# Patient Record
Sex: Female | Born: 1955 | Race: White | Hispanic: No
Health system: Midwestern US, Academic
[De-identification: ages and names within clinical notes are randomized; demographics above are authoritative.]

---

## 2016-06-27 ENCOUNTER — Encounter: Admit: 2016-06-27 | Discharge: 2016-06-27 | Payer: Commercial Managed Care - Pharmacy Benefit Manager

## 2016-06-27 NOTE — Telephone Encounter
This MA LVM notifying pt Labs are past due and to get the drawn as quickly as possible.

## 2016-07-05 ENCOUNTER — Encounter: Admit: 2016-07-05 | Discharge: 2016-07-05 | Payer: Commercial Managed Care - Pharmacy Benefit Manager

## 2016-07-05 NOTE — Telephone Encounter
This MA LVM reminding pt to get past due HCV labs drawn asap.

## 2016-07-06 ENCOUNTER — Encounter: Admit: 2016-07-06 | Discharge: 2016-07-06 | Payer: Commercial Managed Care - Pharmacy Benefit Manager

## 2016-07-06 NOTE — Progress Notes
Laura Klein completed hepatitis C treatment on 03/30/16.   The patient has received the entire supply of medication and no longer requires additional refills.        Additional testing is needed to proceed with evaluation of sustained viral response (SVR).  The patients clinic nurse was notified that the patient needs a hepatitis C viral load twelve weeks after completion of hepatitis C treatment to establish SVR.     Once the hepatitis C viral load lab is completed, SVR will be evaluated.  The patients clinic nurse is aware .         Annamarie DawleyJennifer Tammra Pressman, [place]  [phone]

## 2016-07-13 ENCOUNTER — Encounter: Admit: 2016-07-13 | Discharge: 2016-07-13 | Payer: Commercial Managed Care - Pharmacy Benefit Manager

## 2016-08-03 ENCOUNTER — Encounter: Admit: 2016-08-03 | Discharge: 2016-08-03 | Payer: Commercial Managed Care - Pharmacy Benefit Manager

## 2016-08-03 NOTE — Progress Notes
Inez CatalinaMarlene Ann Kozloski completed hepatitis C treatment on 03/30/16.   The patient has received the entire supply of medication and no longer requires additional refills.         Additional testing is needed to proceed with evaluation of sustained viral response (SVR).  The patients clinic nurse was notified that the patient needs a hepatitis C viral load twelve weeks after completion of hepatitis C treatment to establish SVR.     The patient has not completed the hepatitis C viral load lab to evaluate SVR.  Thus, the patient will be removed from the specialty patient management program.  The patient may be re-enrolled at any time should they complete necessary labs or re-establish care with the clinic.  The patients clinic nurse is aware to contact the hepatology pharmacist at [phone] or [email or web address] with questions and/or updates.         Waunita SchoonerMeera Dalanie Kisner, Jacklynn BuePHARMD, PharmD  [phone]

## 2017-08-18 ENCOUNTER — Encounter: Admit: 2017-08-18 | Discharge: 2017-08-18 | Payer: Commercial Managed Care - Pharmacy Benefit Manager

## 2017-08-20 IMAGING — US ABDC
1 series · 14 of 25 positions shown · non-contrast
Comparison: none

[Series 1: us abdominal complete · 14 of 87 slices shown]
[im 1/87]
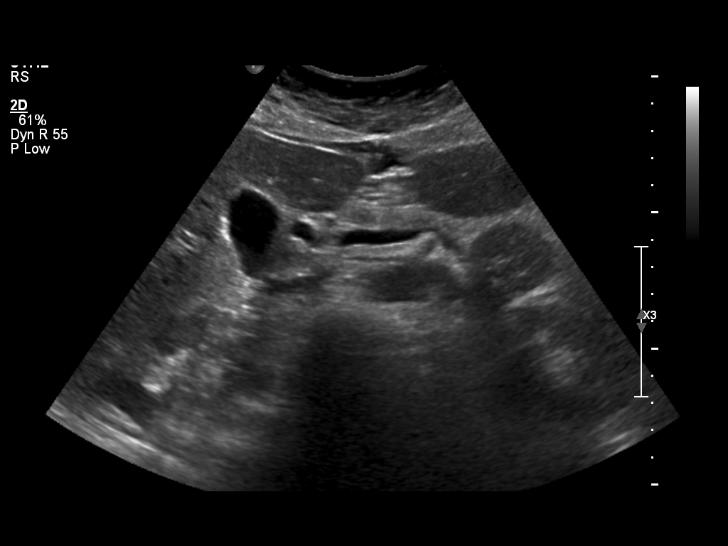
[im 8/87]
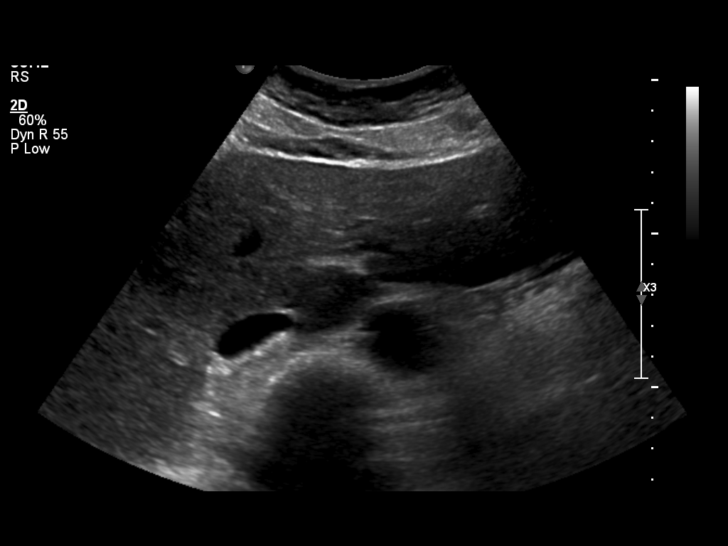
[im 15/87]
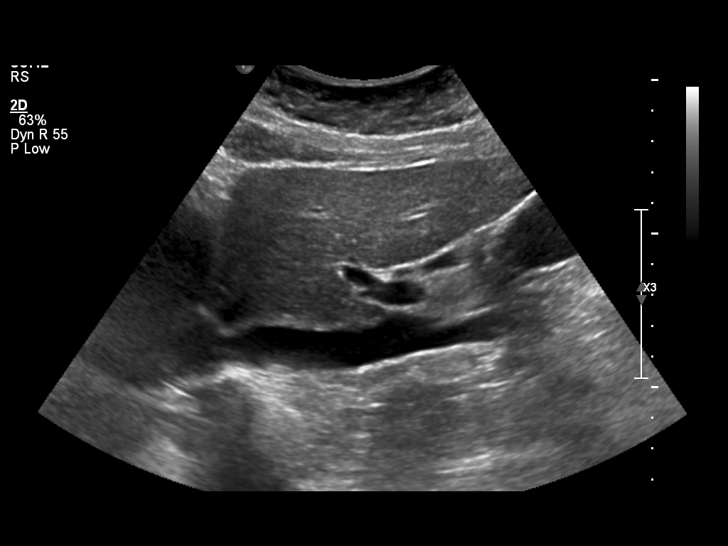
[im 22/87]
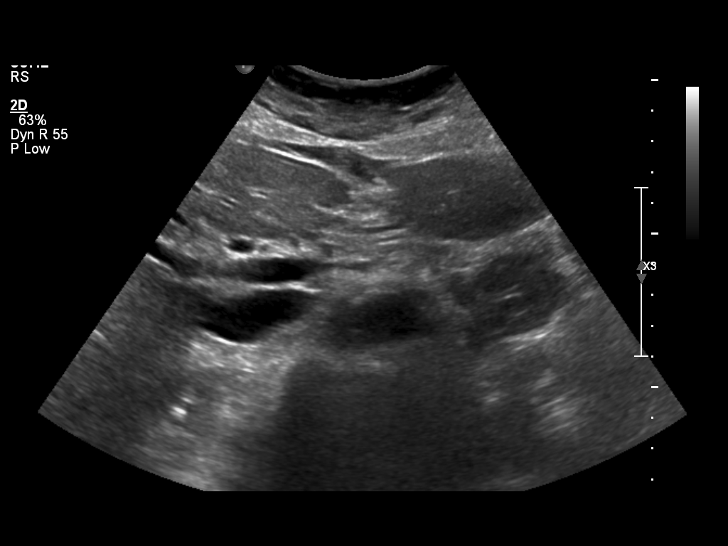
[im 29/87]
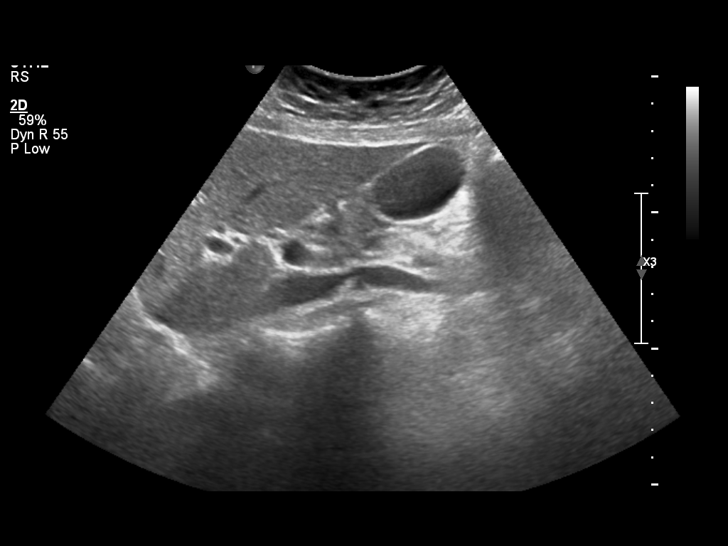
[im 33/87]
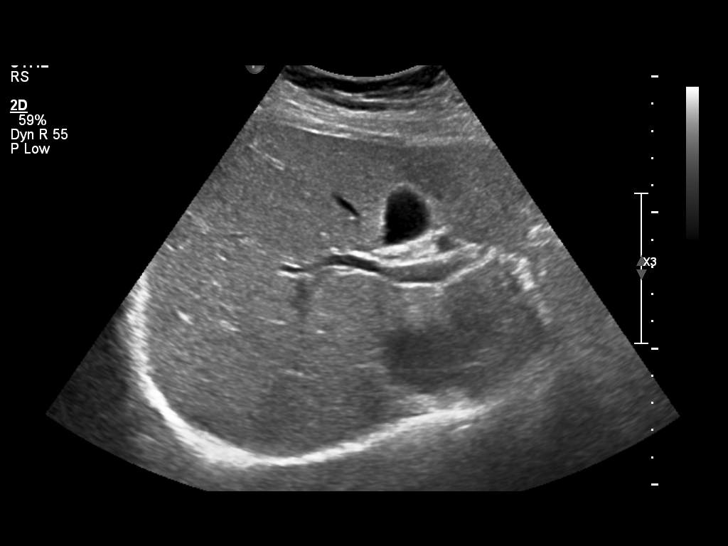
[im 40/87]
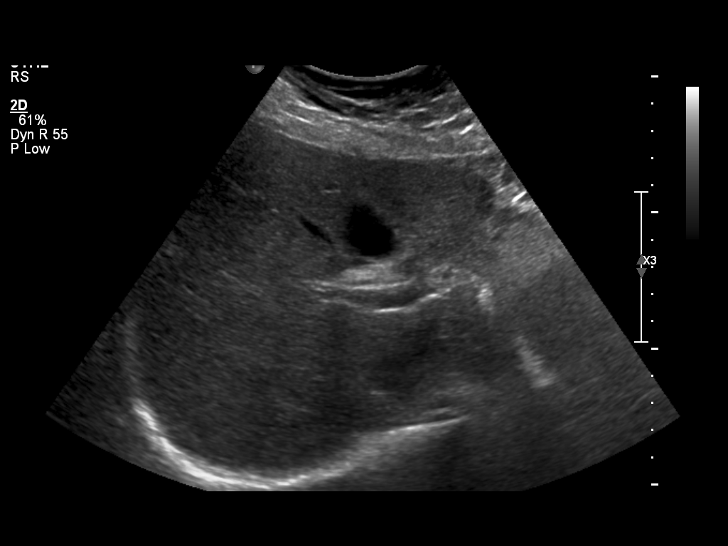
[im 47/87]
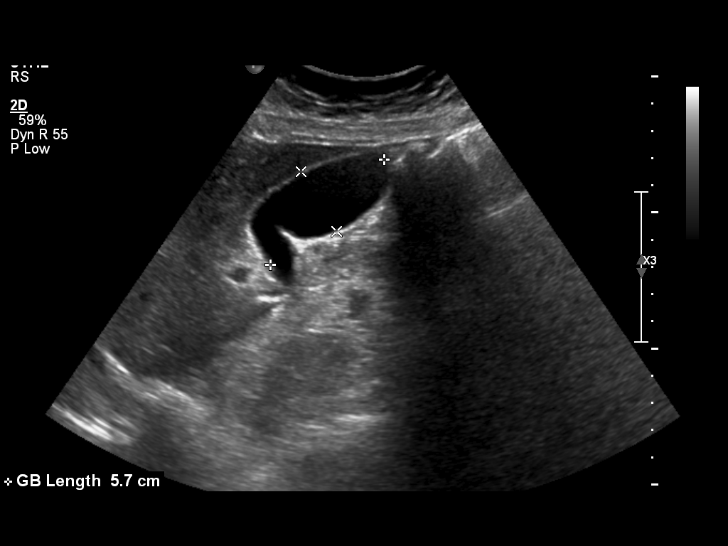
[im 54/87]
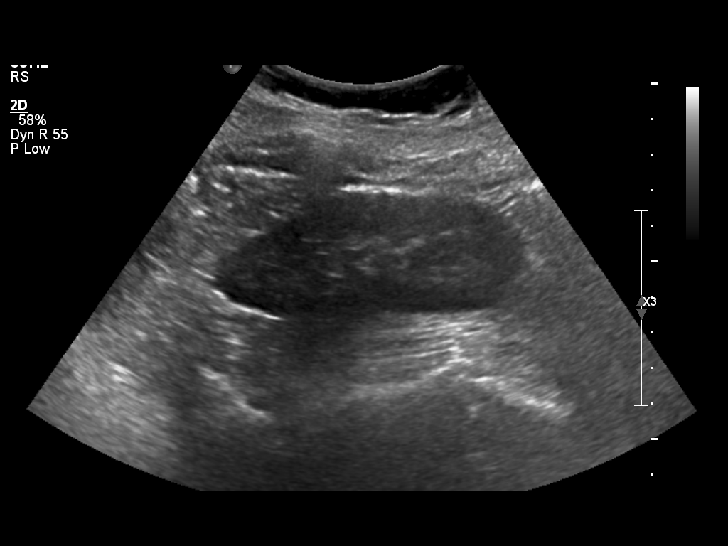
[im 58/87]
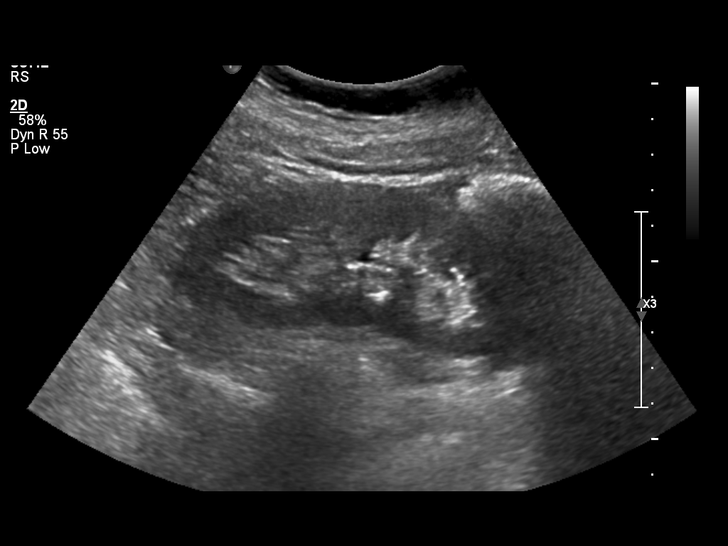
[im 65/87]
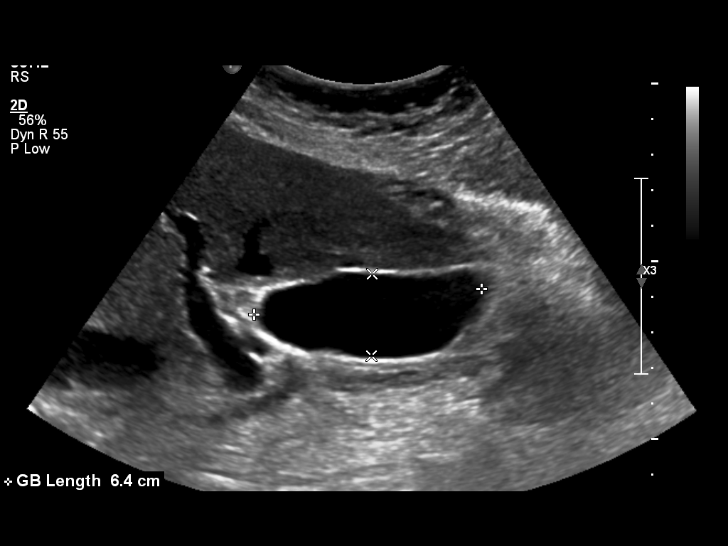
[im 72/87]
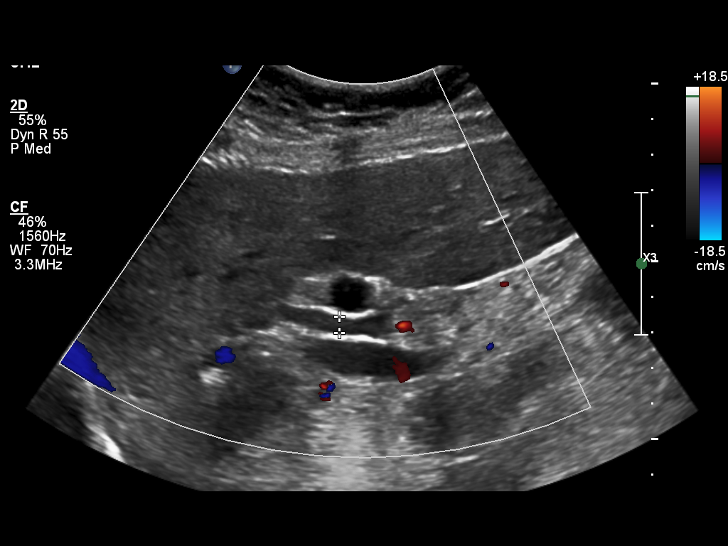
[im 79/87]
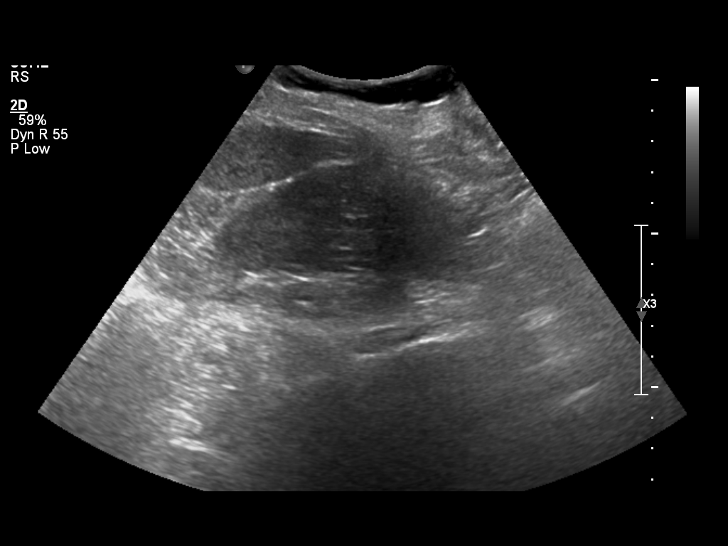
[im 87/87]
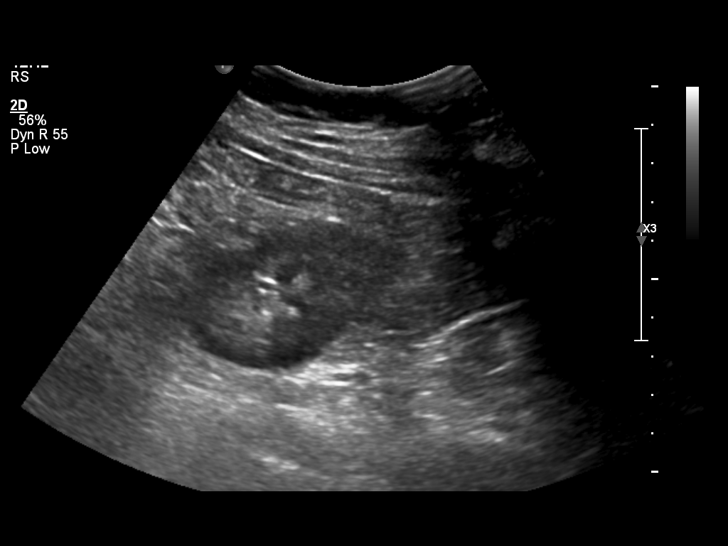

[14 of 25 positions shown; findings below may reference images not displayed]

ULTRASOUND REPORT

DIAGNOSTIC STUDIES

EXAM

ABDOMINAL ULTRASOUND

INDICATION

HEPATITIS C
HEPATITIS C. PT STATED HX OF DRINKING 15 YRS AGO.

TECHNIQUE

Real time sonography of the abdomen is performed.

COMPARISONS

None

FINDINGS

The size and contour of the liver and spleen are normal. No focal parenchmymal abnormalities are
identified.

Gallbadder size and wall thickness are normal. No gallstones are identified. No tenderness to
placement of the transducer over the gallbladder fossa is present. No pericholecystic fluid is
present. Common duct is Maness measuring 0.49 cm.

The pancreas is incompletely visualized; the visualized portion is unremarkable.

The visualized portions of the aorta and IVC are unremarkable.

Size and contour of the kidneys are normal without focal parenchymal abnormality. Right kidney
measures 10.3 x 4.0 x 4.7cm and the left kidney measures 10.5 x 5.3 x 4.6cm. No hydronephrosis is
identified.

IMPRESSION

Incomplete visualization of the pancreas. Otherwise, normal real-time sonography of the abdomen.

## 2017-10-06 ENCOUNTER — Encounter: Admit: 2017-10-06 | Discharge: 2017-10-06 | Payer: Commercial Managed Care - Pharmacy Benefit Manager

## 2018-03-09 LAB — COMPREHENSIVE METABOLIC PANEL
Lab: 0.2
Lab: 0.6
Lab: 10
Lab: 13
Lab: 135 — ABNORMAL LOW (ref 136–145)
Lab: 15
Lab: 26
Lab: 3.7
Lab: 60 — ABNORMAL HIGH (ref 5–34)
Lab: 66 — ABNORMAL HIGH (ref 0–55)
Lab: 7.6
Lab: 80
Lab: 92

## 2018-03-09 LAB — TROPONIN-I: Lab: 0 — ABNORMAL HIGH (ref 0.000–0.013)

## 2018-03-09 LAB — CREATINE KINASE-CPK: Lab: 705 — ABNORMAL HIGH (ref 29–168)

## 2018-03-11 LAB — BASIC METABOLIC PANEL
Lab: 0.7
Lab: 13
Lab: 138 — ABNORMAL LOW (ref 4.20–5.40)
Lab: 21 — ABNORMAL HIGH (ref 9.8–20.1)
Lab: 27
Lab: 4.1 — ABNORMAL LOW (ref 12.0–16.0)
Lab: 80 — ABNORMAL HIGH (ref 11.5–14.5)
Lab: 89
Lab: 9.4

## 2018-03-11 LAB — CBC: Lab: 12 — ABNORMAL HIGH (ref 4.8–10.8)

## 2018-06-01 ENCOUNTER — Encounter: Admit: 2018-06-01 | Discharge: 2018-06-01 | Payer: Commercial Managed Care - Pharmacy Benefit Manager

## 2018-06-04 ENCOUNTER — Encounter: Admit: 2018-06-04 | Discharge: 2018-06-04 | Payer: Commercial Managed Care - Pharmacy Benefit Manager

## 2018-06-04 DIAGNOSIS — R079 Chest pain, unspecified: ICD-10-CM

## 2018-06-04 DIAGNOSIS — R55 Syncope and collapse: Principal | ICD-10-CM

## 2018-06-05 ENCOUNTER — Encounter: Admit: 2018-06-05 | Discharge: 2018-06-05 | Payer: Commercial Managed Care - Pharmacy Benefit Manager

## 2018-06-05 DIAGNOSIS — R079 Chest pain, unspecified: ICD-10-CM

## 2018-06-05 DIAGNOSIS — R55 Syncope and collapse: Principal | ICD-10-CM

## 2018-06-05 NOTE — Progress Notes
Called pt to go over in clinic guidelines for appointment w/ [facility] on 5.19.20. pt is not feeling well today- diarrhea and weakness. pt wishes to cancel appointment today. rescheduled for 5.21.20 @ 400 w/ TLR. gave pt my desk number to call on Thursday if still not feeling well. Advised pt to contact pcp w/ symptoms

## 2018-06-07 ENCOUNTER — Encounter: Admit: 2018-06-07 | Discharge: 2018-06-07 | Payer: Commercial Managed Care - Pharmacy Benefit Manager

## 2019-09-07 IMAGING — CT BRAIN WO(Adult)
3 of 4 series · 14 of 27 positions shown, 16 images · non-contrast
Comparison: none

[Series 2: brain ax 5.00 hr40 s3 · axial · 0.35mm/px · z∈[-544,-459]mm · 8 of 12 slices shown, 10 images]
[im 2/12  brain]
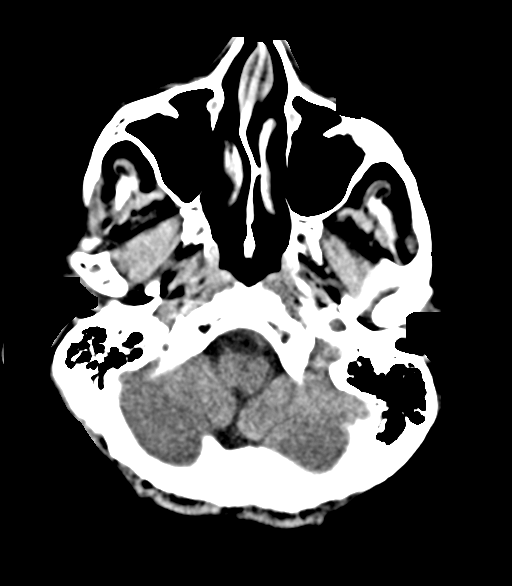
[im 2/12  bone]
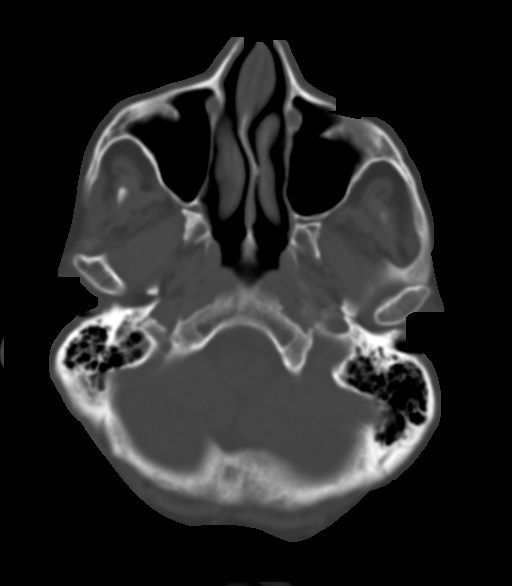
[im 3/12  brain]
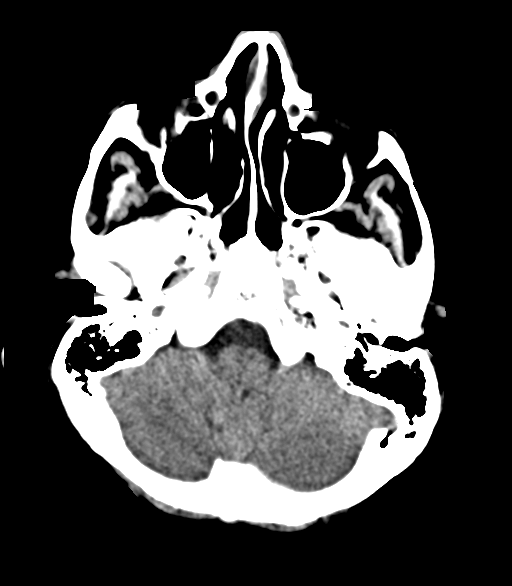
[im 5/12  brain]
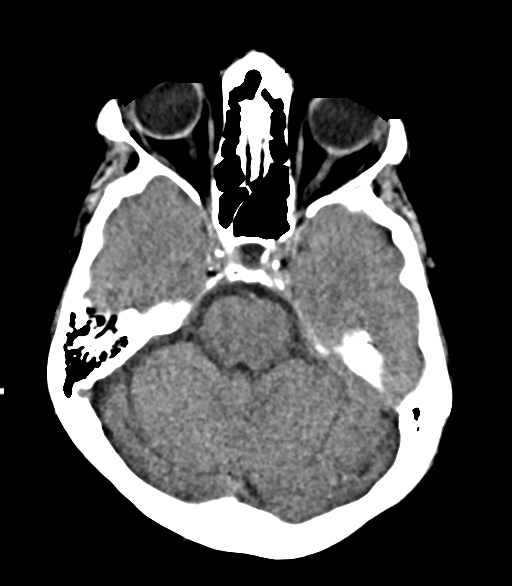
[im 6/12  brain]
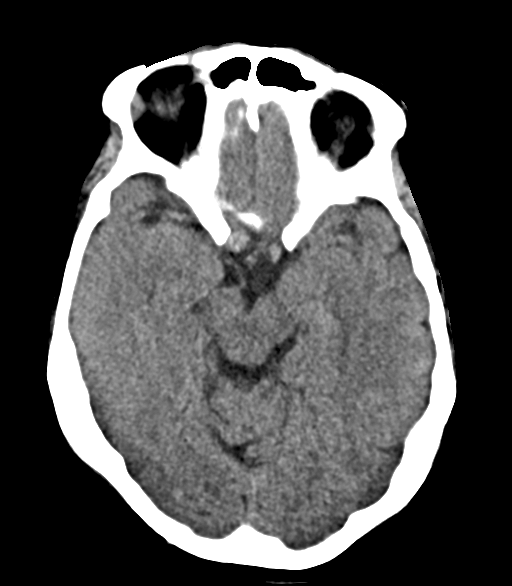
[im 7/12  brain]
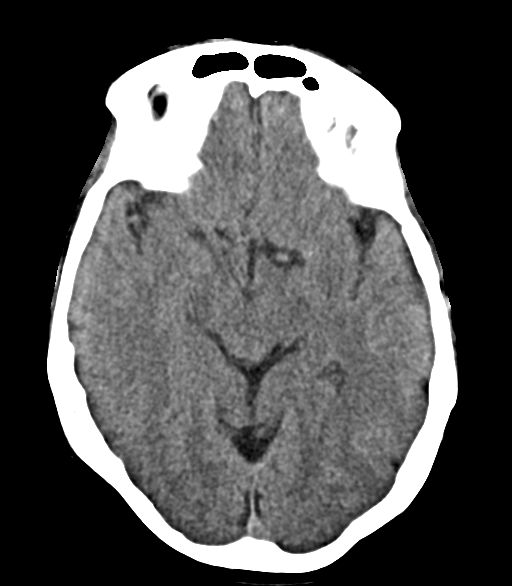
[im 7/12  bone]
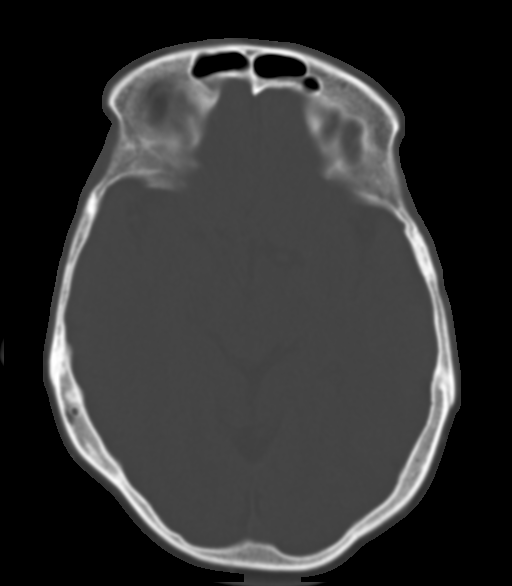
[im 8/12  brain]
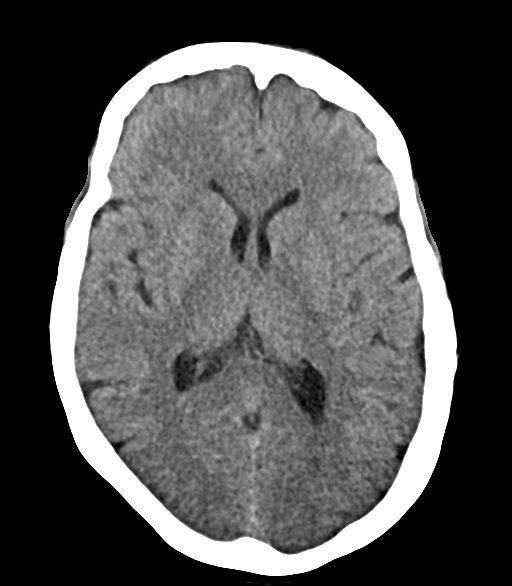
[im 10/12  brain]
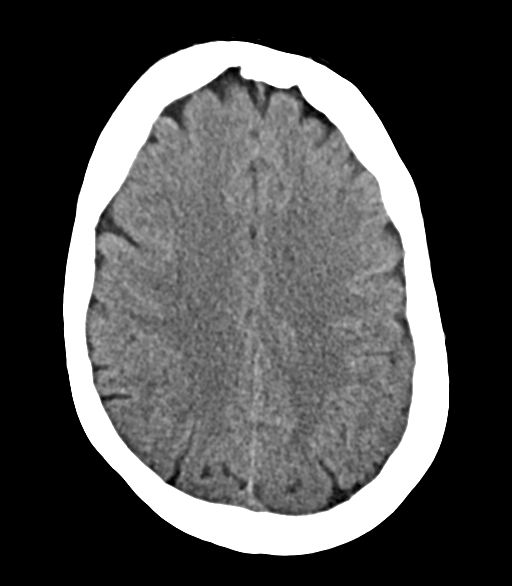
[im 11/12  brain]
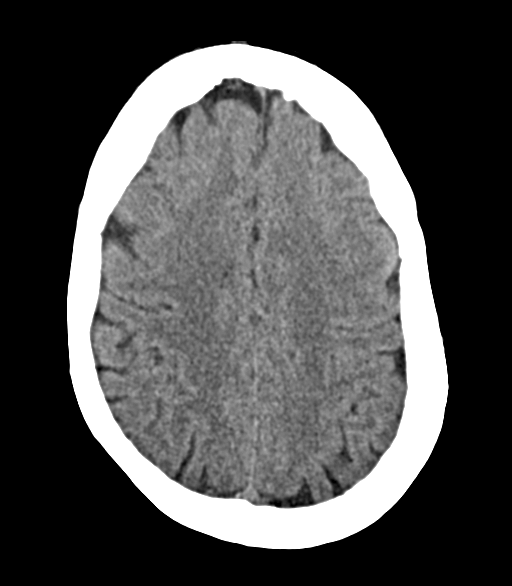

[Series 4: brain cor 5.00 hr40 s3 · coronal · 0.33mm/px · 3 of 9 slices shown]
[im 4/9  brain]
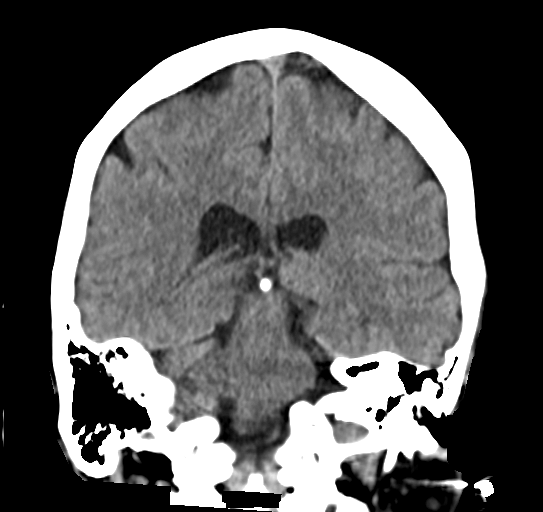
[im 5/9  brain]
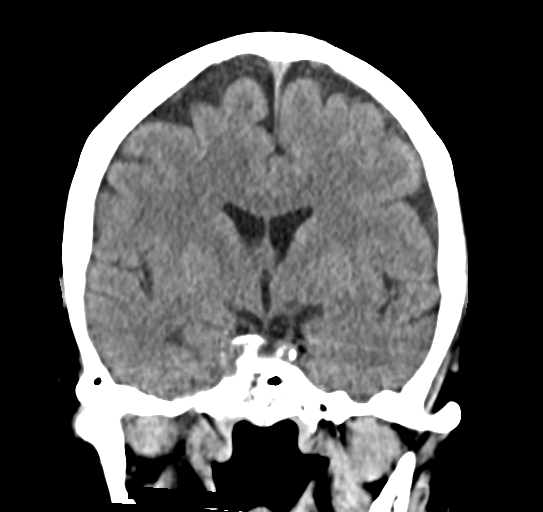
[im 6/9  brain]
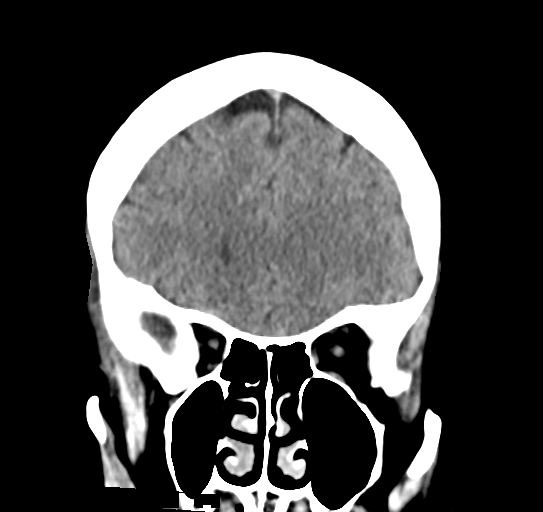

[Series 6: brain sag 5.00 hr40 s3 · sagittal · 0.33mm/px · 3 of 5 slices shown]
[im 2/5  brain]
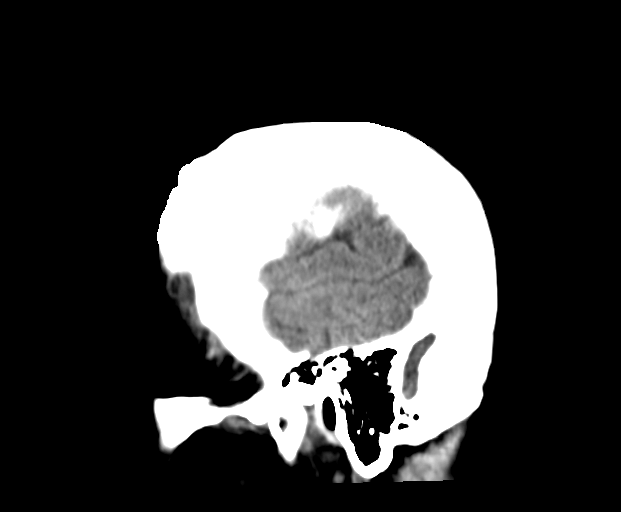
[im 3/5  brain]
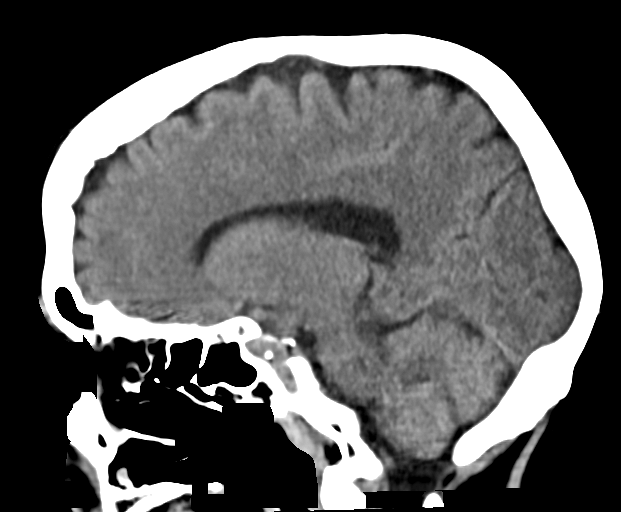
[im 4/5  brain]
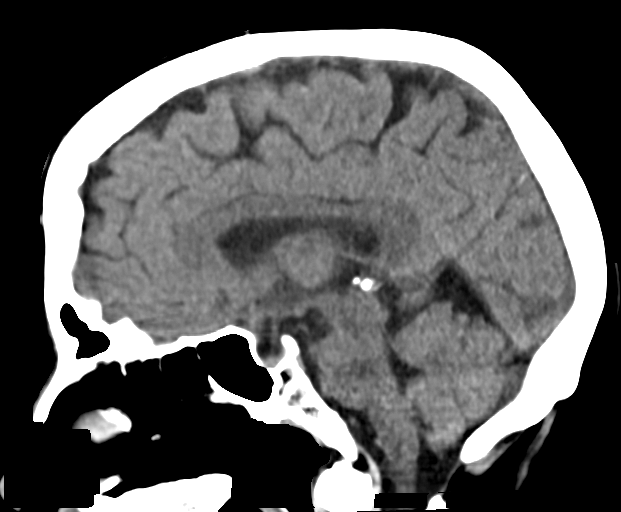

[14 of 27 positions shown; findings below may reference images not displayed]

DIAGNOSTIC STUDIES

EXAM

COMPUTED TOMOGRAPHY, HEAD OR BRAIN; WITHOUT CONTRAST MATERIAL, CPT 74414

INDICATION

CLOSED HEAD INJURY. CONFUSION. PATIENT COMPLAINS OF SUSTAINING A FALL X 3 DAYS AGO. HEADACHE.
SORE. TJ

TECHNIQUE

Multiple contiguous transaxial images were obtained through the brain utilizing a multidetector CT
scanner.

All CT scans at this facility use dose modulation, iterative reconstruction, and/or weight based
dosing when appropriate to reduce radiation dose to as low as reasonably achievable.

COMPARISONS

There are no previous examinations available for comparison at the time of dictation.

FINDINGS

The ventricles and sulci appear are normal in caliber. There is no mass effect or shift in the
midline structures. There are no intra or extra axial collections of blood or fluid present. The
skull base and overlying calvarium are within normal limits.

IMPRESSION

Normal CT brain.

Tech Notes:

PT C/O FALL X 3 DAYS AGO. HEADACHE. SORE. TJ

## 2019-09-11 IMAGING — CR PELVIS
3 series · 3 of 3 positions shown · non-contrast
Comparison: none

[pelvis]
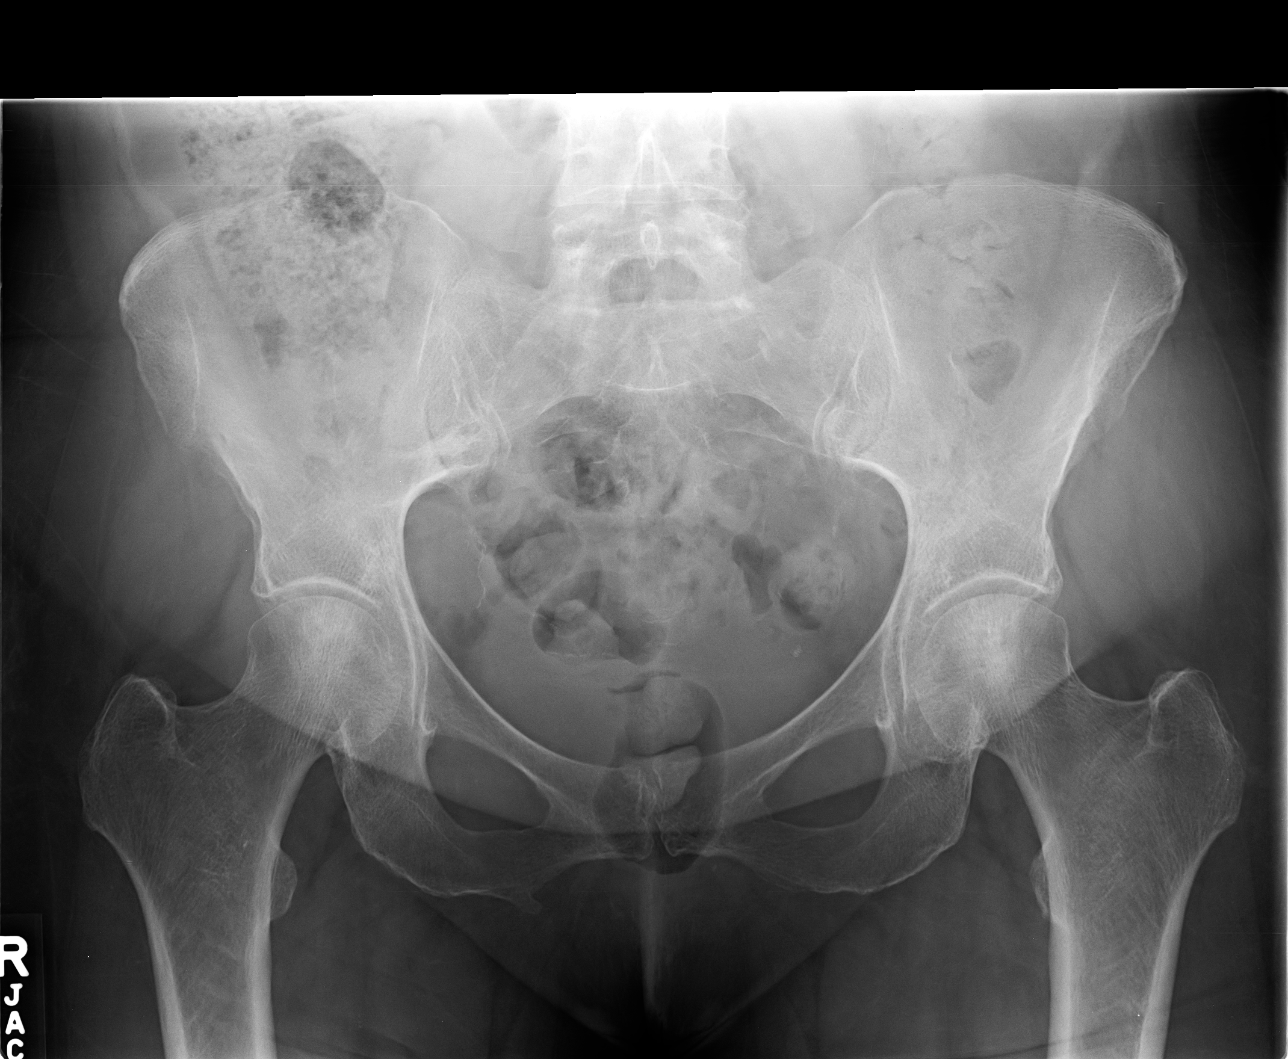

[hip ap]
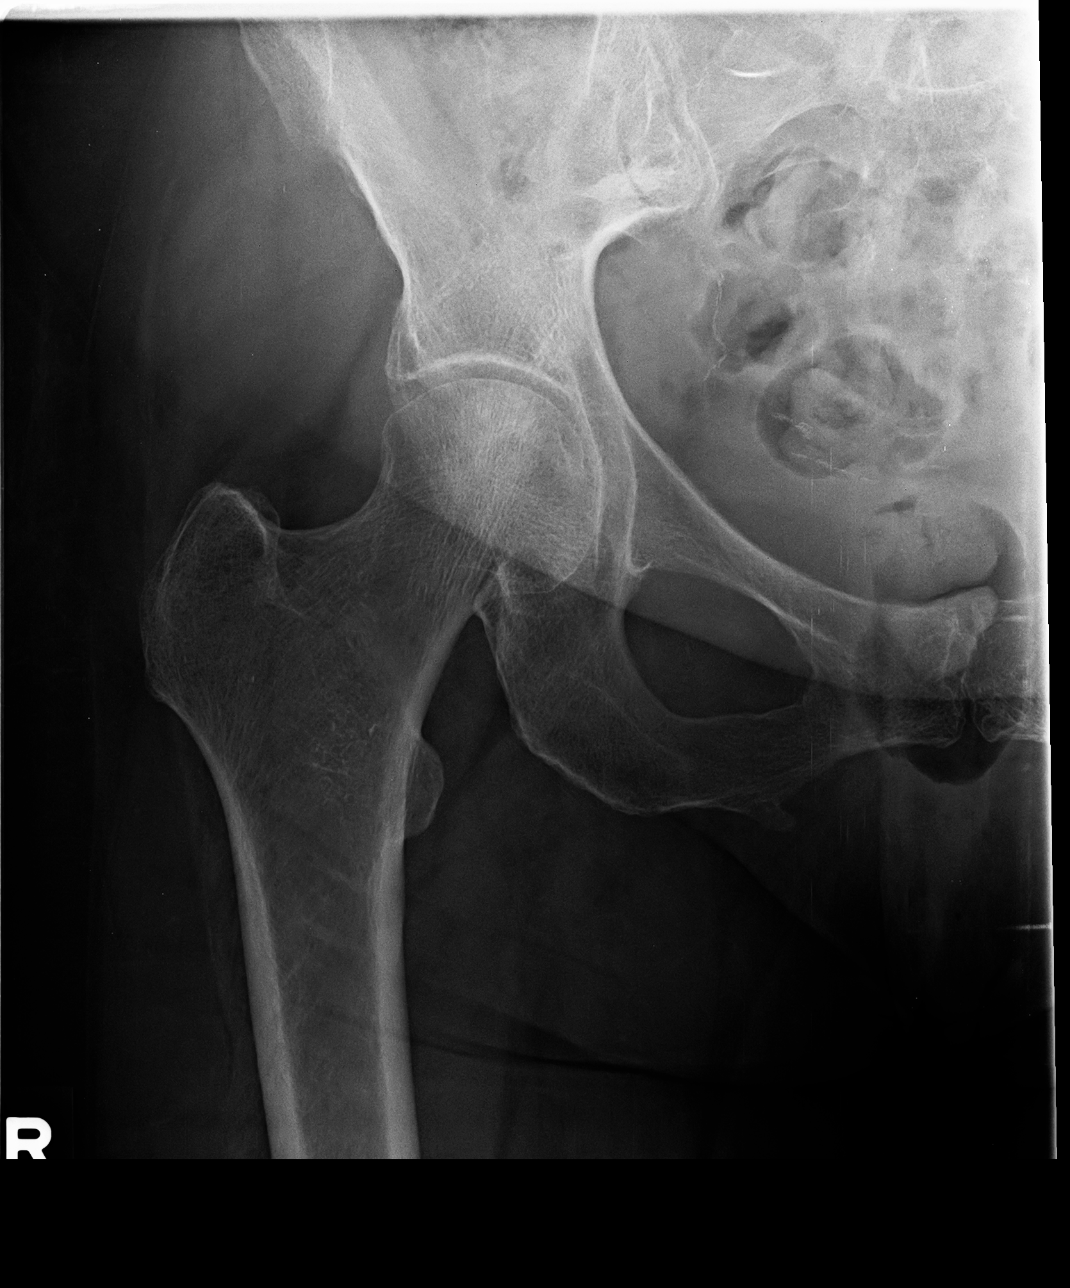

[hip frog]
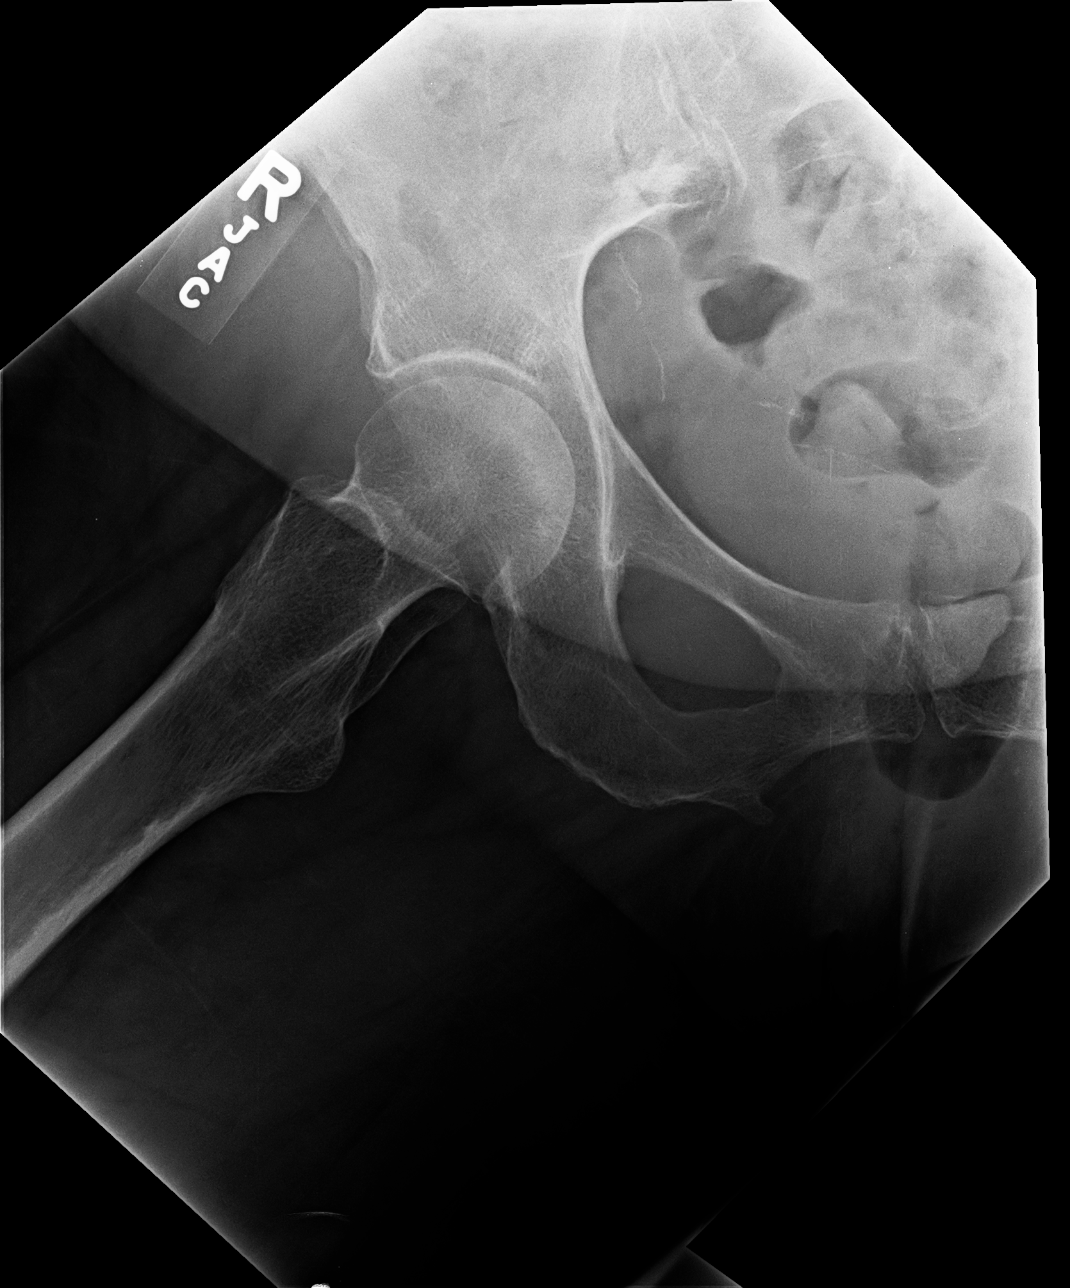

[3 of 3 positions shown; findings below may reference images not displayed]

EXAM

XR hip RT, 2-3V w or wo pelvis

INDICATION

Status post fall

TECHNIQUE

Three views of the right hip

COMPARISONS

None available at the time of dictation.

FINDINGS

No radiographic evidence of an acute fracture, osseous malalignment, or aggressive focal osseous
lesion. Mild degenerative change of the pubic symphyseal joint. Small inferiorly projecting osseous
excrescence along the right inferior pubic ramus, which may be sequelae of prior trauma versus
chronic tug injury along the adductor muscle compartment. Mild amount of stool burden within the
cecum/ascending colon.

IMPRESSION
- No radiographic evidence of an acute osseous abnormality or significant degenerative change of
the right hip.

Tech Notes:

fall today and hit right hip. jc/me

## 2019-09-11 IMAGING — CR PELVIS
3 series · 3 of 3 positions shown · non-contrast
Comparison: none

[l-spine ap]
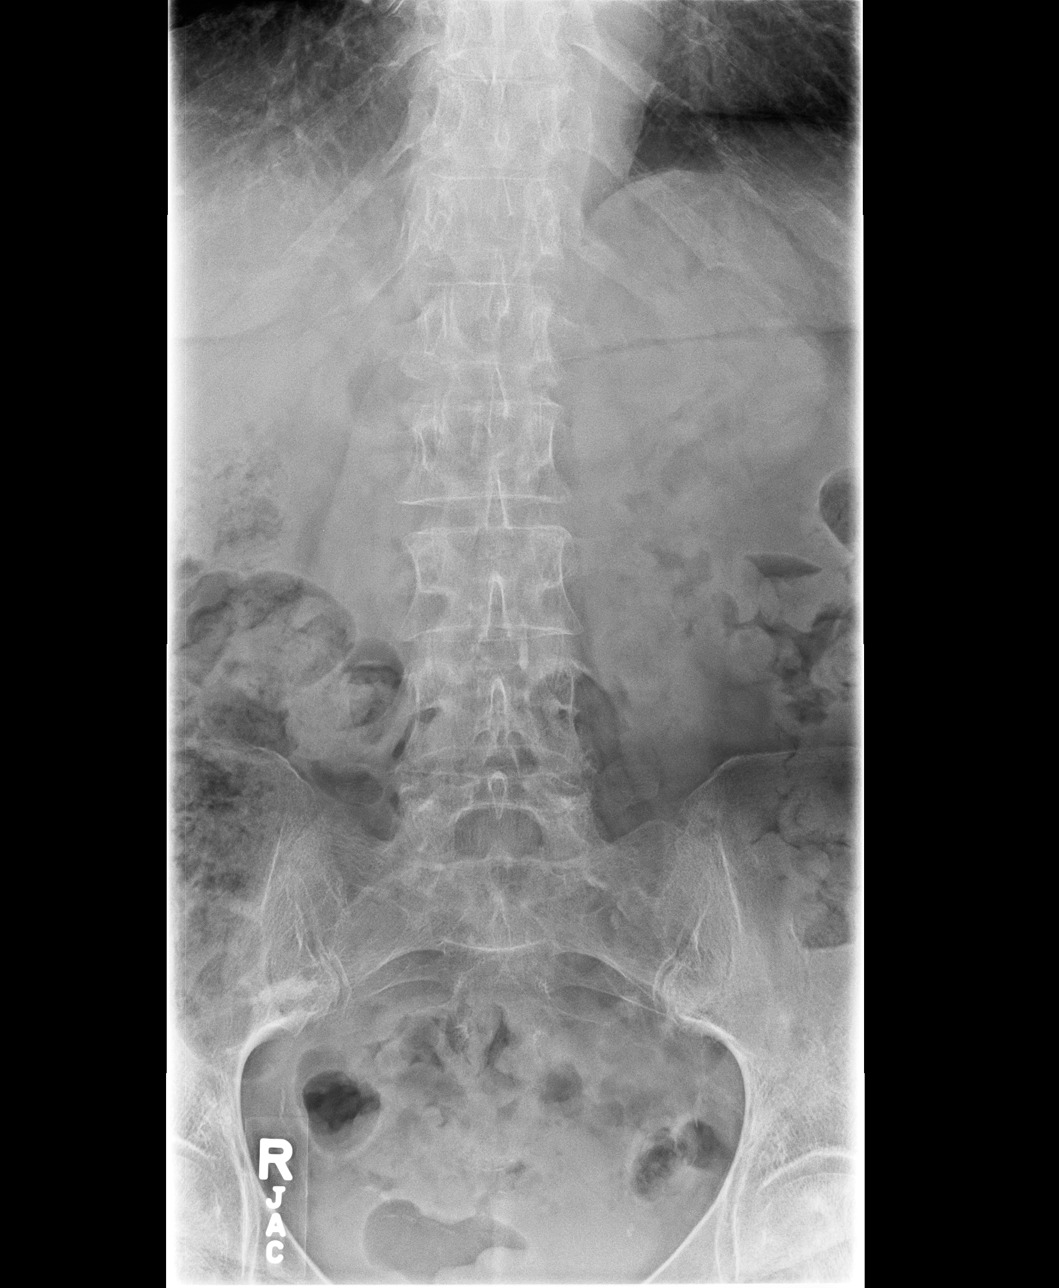

[l-spine lat]
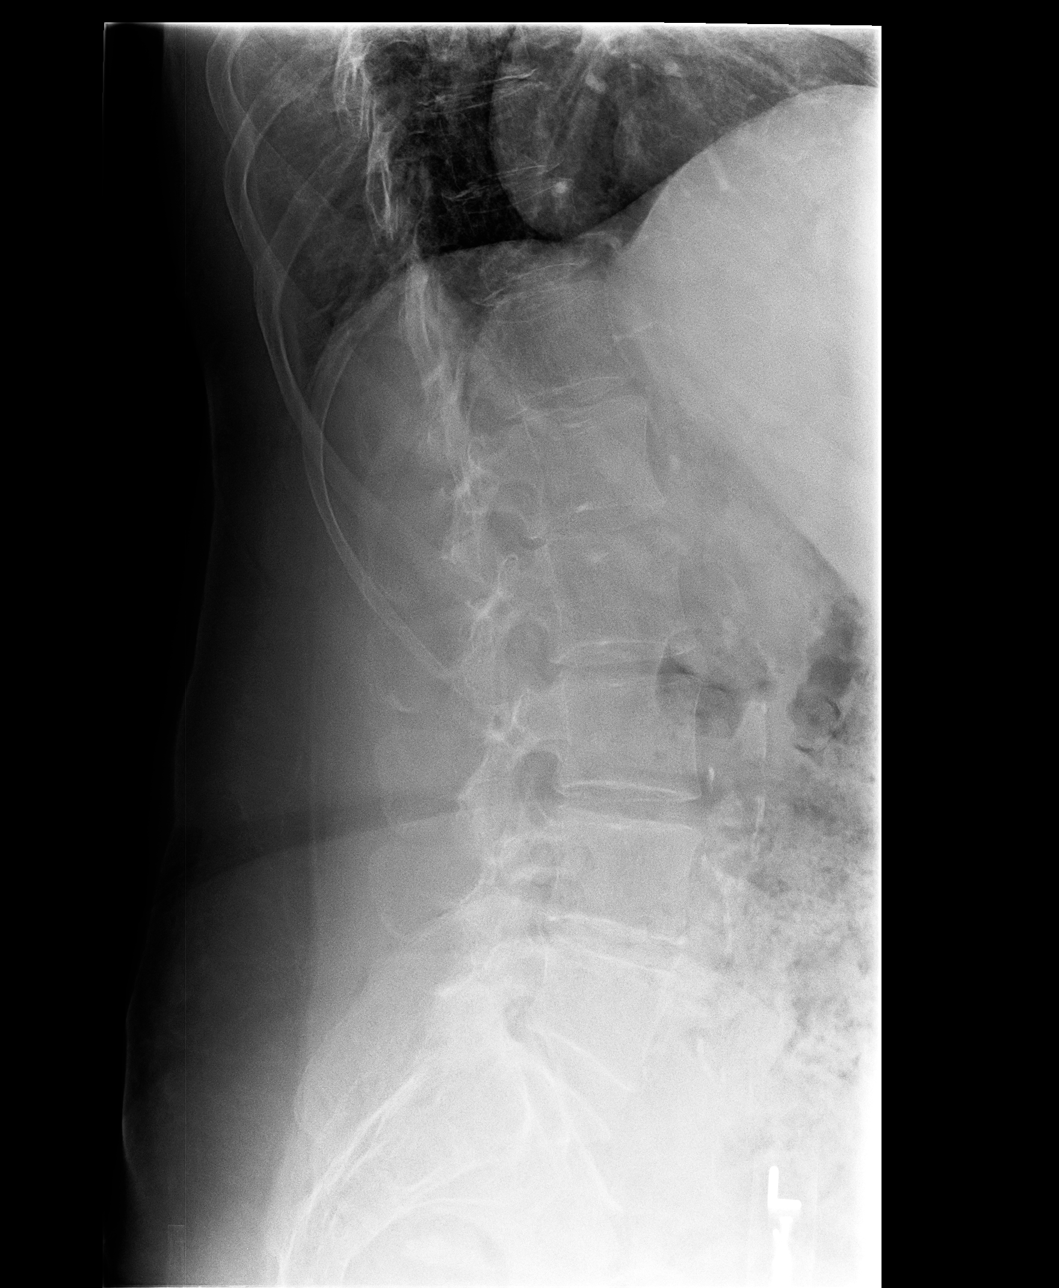

[l-spine l5-s1]
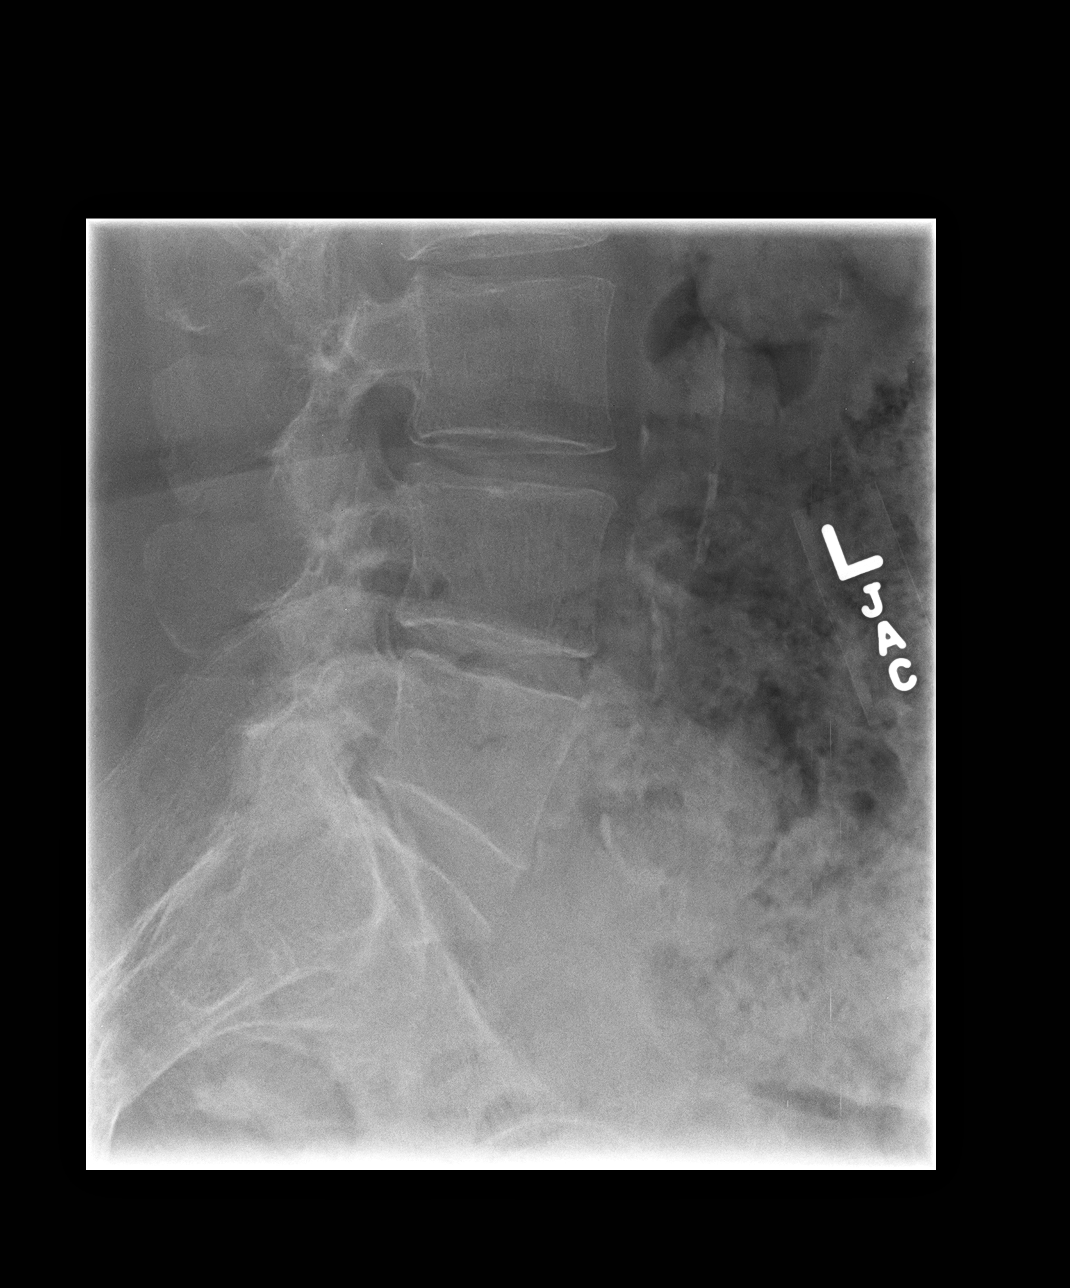

[3 of 3 positions shown; findings below may reference images not displayed]

EXAM

XR lumbar spine 2-3V

INDICATION

Status post fall

TECHNIQUE

Three views of the lumbosacral spine

COMPARISONS

None available at the time of dictation.

FINDINGS

No radiographic evidence of an acute fracture, osseous malalignment, or aggressive focal osseous
lesion. No endplate compression fracture, spondylolisthesis, or spondylolysis. Mild to moderate amo
unt of stool burden within the colon. There is diffusely decreased bone mineralization. The
sacroiliac joints appear symmetric.

IMPRESSION
- No radiographic evidence of an acute osseous abnormality or significant degenerative change of
the lumbosacral spine.

Tech Notes:

fall today and hit right hip. Pain in low back. jc/me

## 2019-09-11 IMAGING — US VDUPLEBI
1 series · 14 of 16 positions shown · non-contrast
Comparison: none

[Series 1: us venous duplex low ext bi · portal-venous · 14 of 64 slices shown]
[im 1/64]
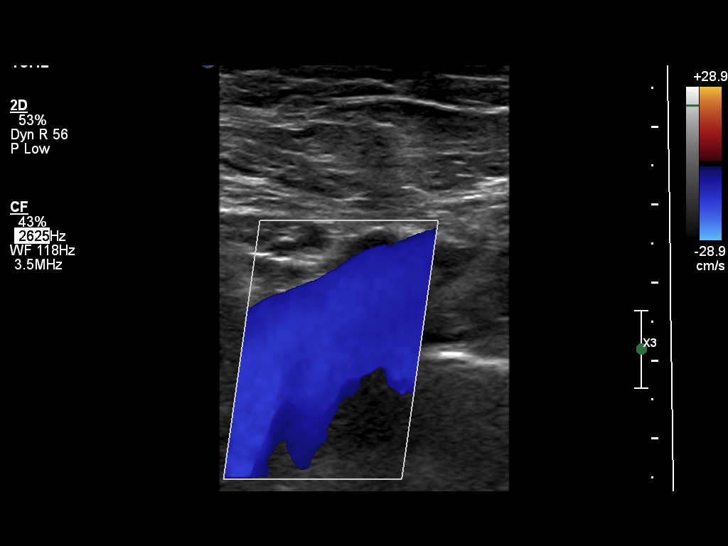
[im 5/64]
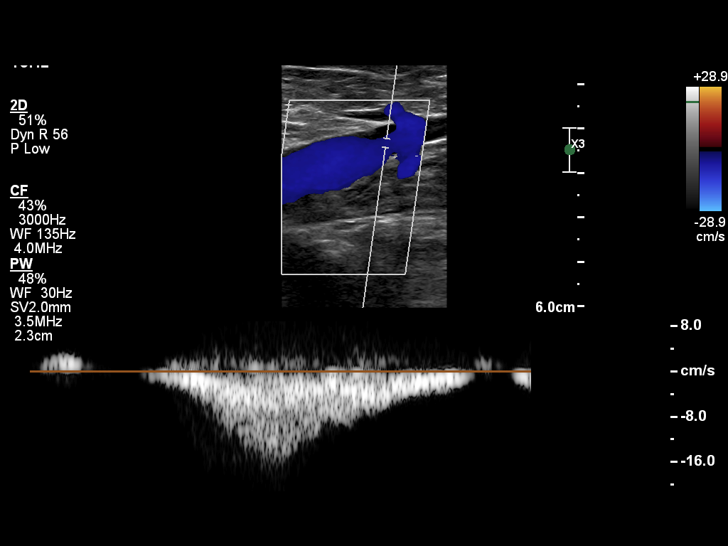
[im 9/64]
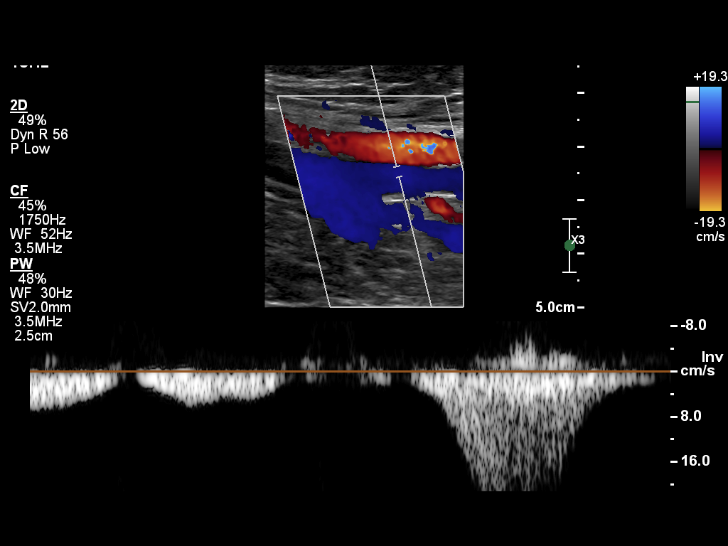
[im 17/64]
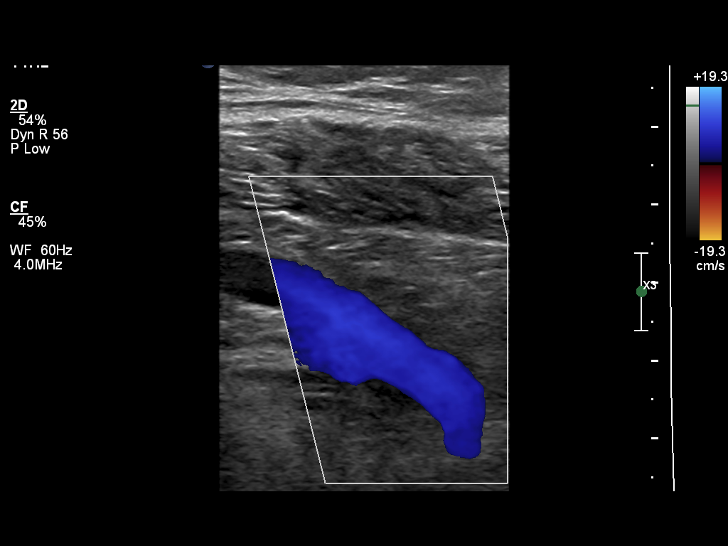
[im 22/64]
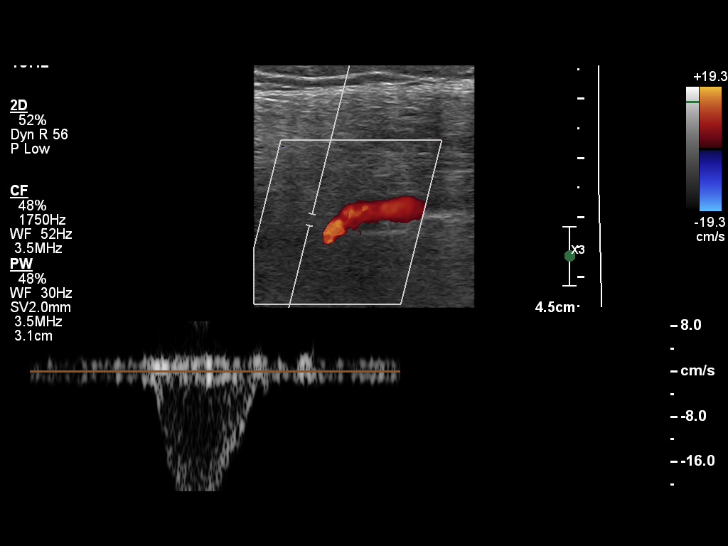
[im 26/64]
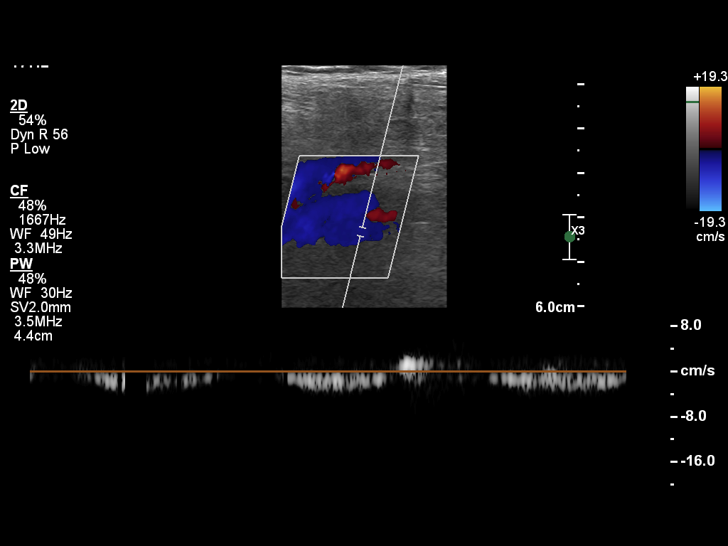
[im 30/64]
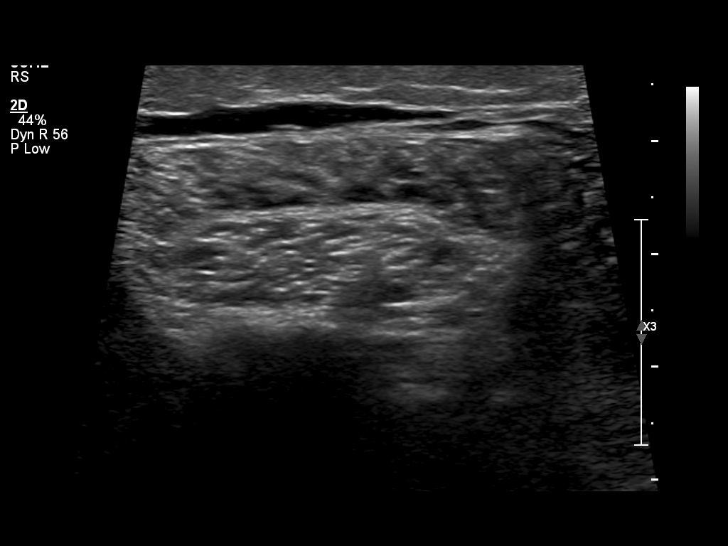
[im 34/64]
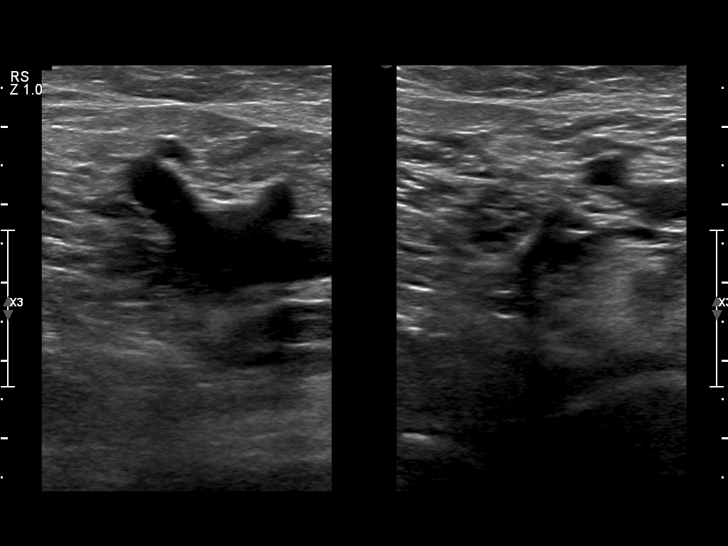
[im 38/64]
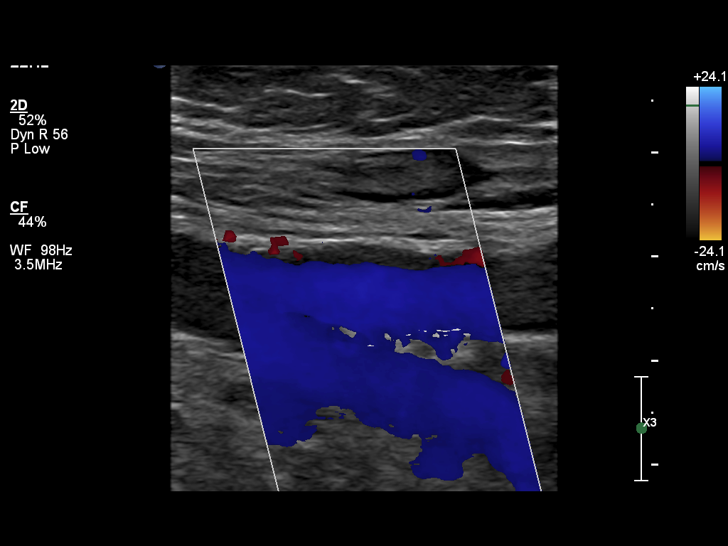
[im 43/64]
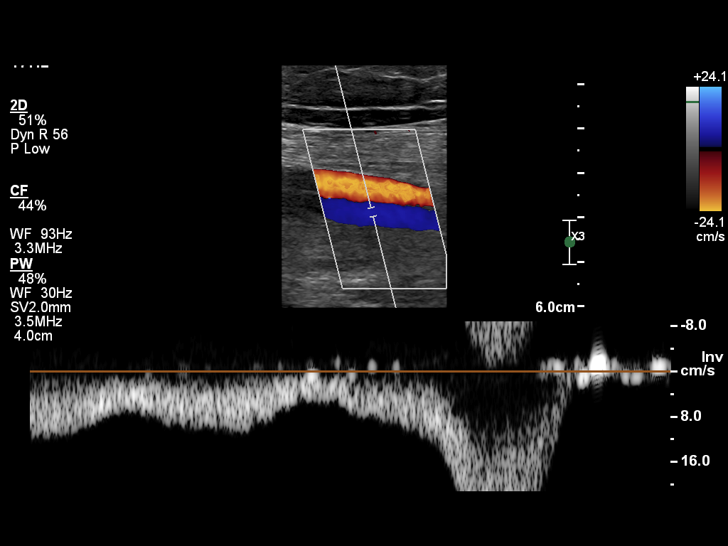
[im 51/64]
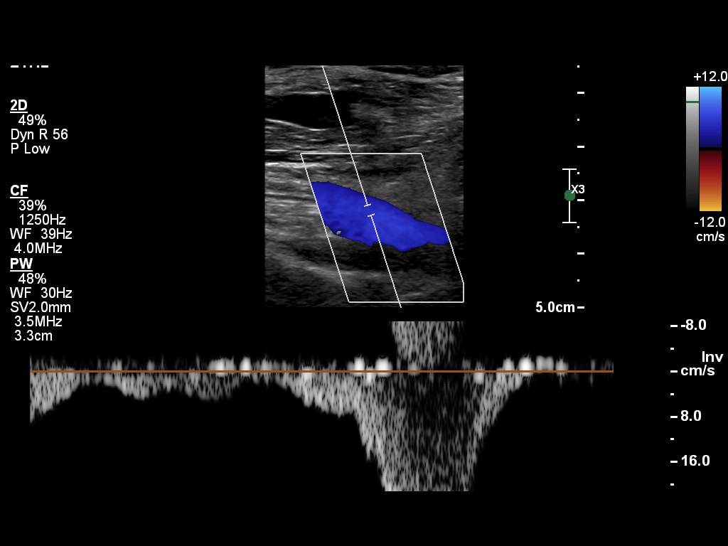
[im 55/64]
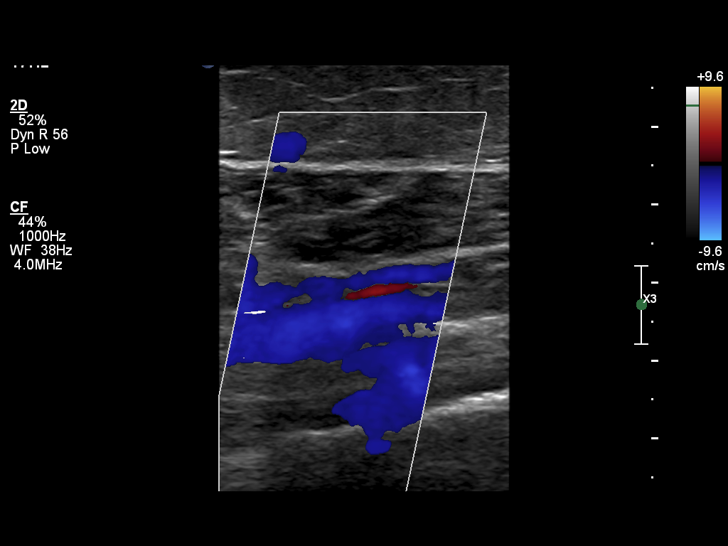
[im 59/64]
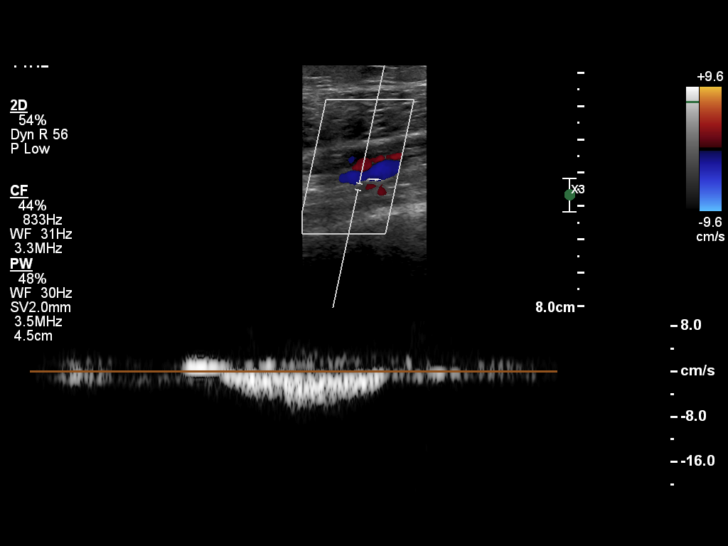
[im 64/64]
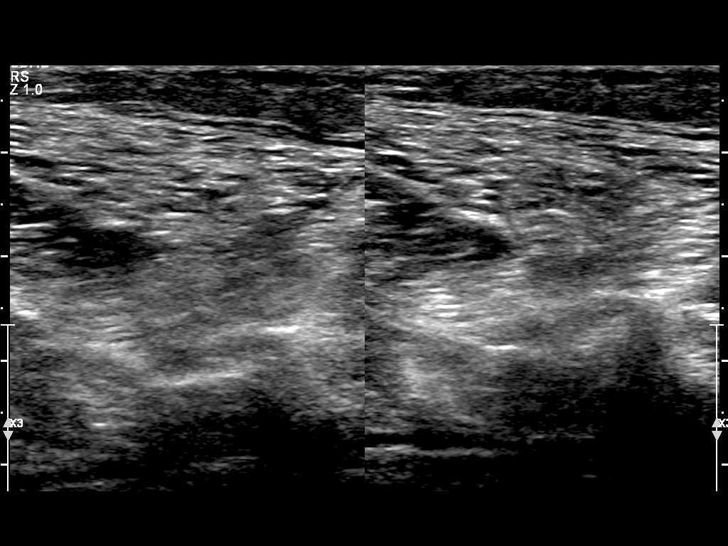

[14 of 16 positions shown; findings below may reference images not displayed]

EXAM

US venous duplex low ext BI

INDICATION

Lower extremity swelling

TECHNIQUE

Grayscale and Color Doppler imaging evaluation in the region of concern was performed

COMPARISONS

None available at the time of dictation.

FINDINGS

Normal compressibility and waveform variability in bilateral lower extremities.

IMPRESSION

No sonographic evidence of deep venous thrombosis within the lower extremities.

Tech Notes:

tb

## 2020-01-19 IMAGING — CR CHEST
1 series · 1 of 1 positions shown · non-contrast
Comparison: none

[chest port x-wise]
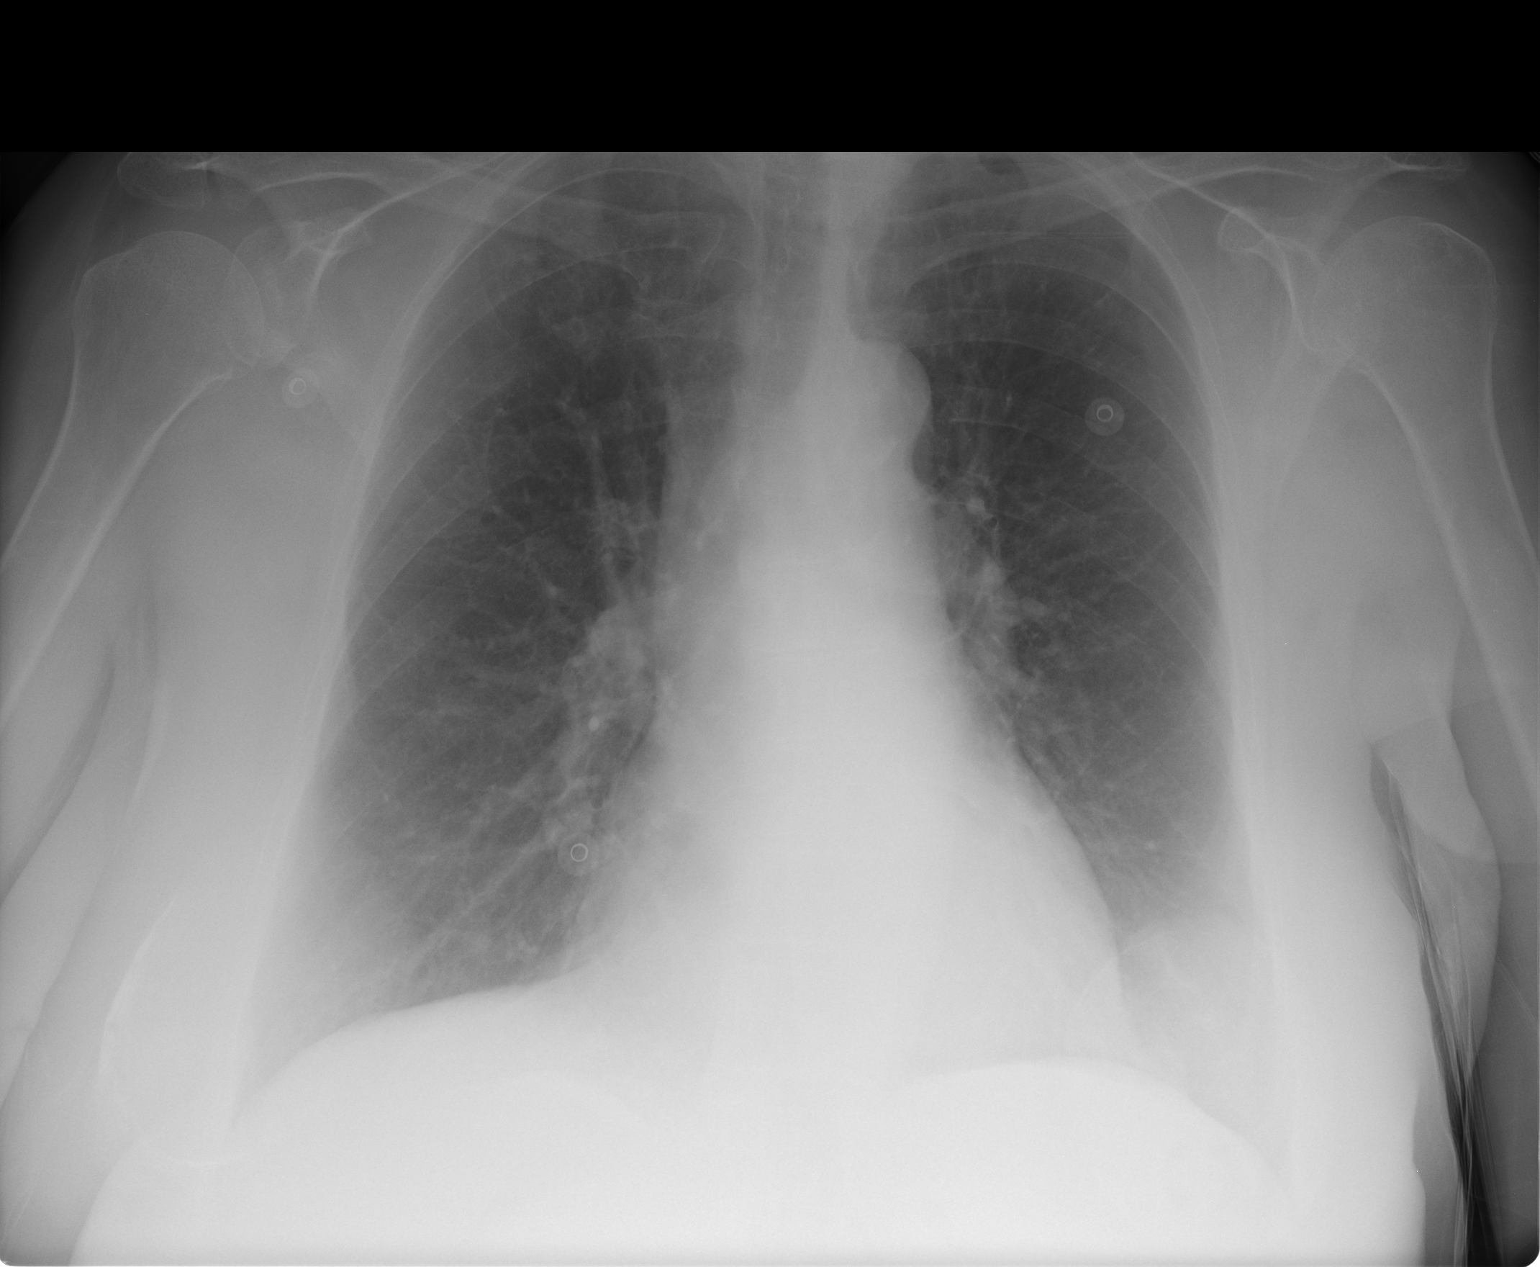

[1 of 1 positions shown; findings below may reference images not displayed]

DIAGNOSTIC STUDIES

EXAM

RADIOLOGICAL EXAMINATION, CHEST; SINGLE VIEW, FRONTAL CPT 52929

INDICATION

Shortness of air

TECHNIQUE

Single AP view chest

COMPARISONS

Chest radiograph March 09, 2018

FINDINGS

There is no focal consolidation, effusion, or pneumothorax. Cardiac silhouette is within normal
limits. The bony thorax is intact. Breast implants noted. Hyperexpanded hyperlucent lungs re-
demonstrated with minimal chronic interstitial opacities.

IMPRESSION

Findings suggesting chronic air trapping without dense consolidating pneumonia or congestive heart
failure.

Tech Notes:

PT c/o SOA   fatigue. JC/RG

## 2020-02-01 IMAGING — MR Hips^ROUTINE
5 of 6 series · 34 of 48 positions shown · non-contrast
Comparison: none

[Series 3: T1 · coronal · 4.0mm · 0.74mm/px · 6 of 20 slices shown]
[im 1/20]
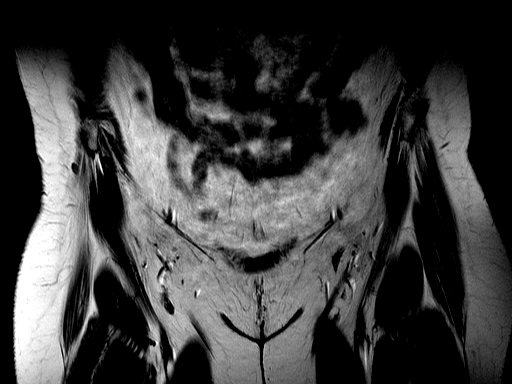
[im 4/20]
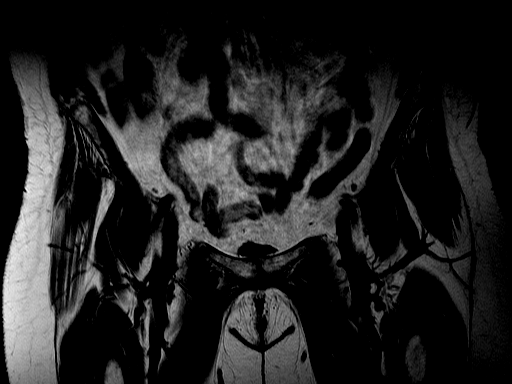
[im 8/20]
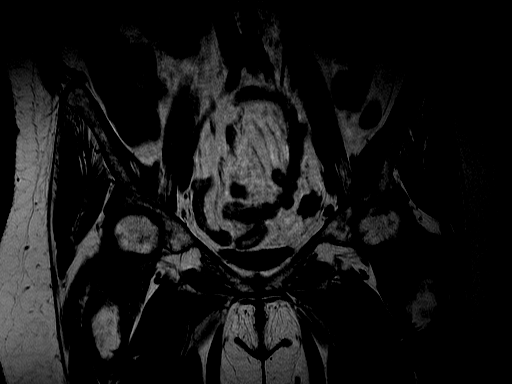
[im 12/20]
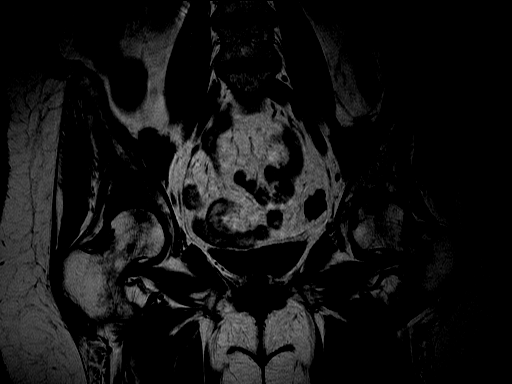
[im 16/20]
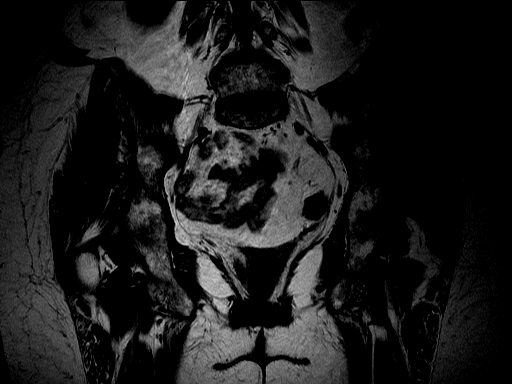
[im 20/20]
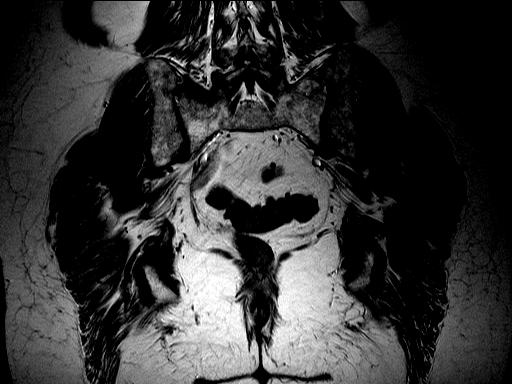

[Series 4: T2 fat-sat · axial · 5.0mm · 0.68mm/px · z∈[-100,+68]mm · 9 of 28 slices shown (1 of 3)]
[im 1/28]
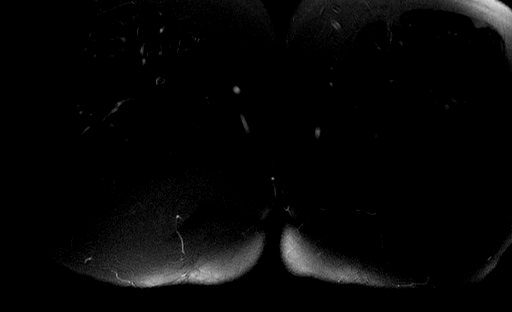
[im 4/28]
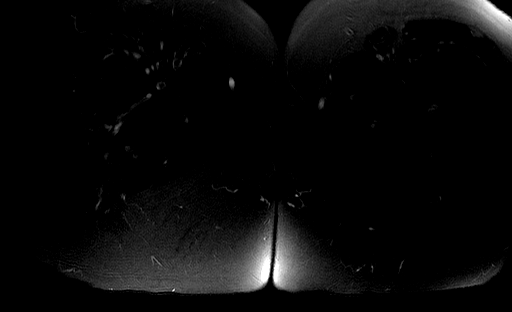
[im 7/28]
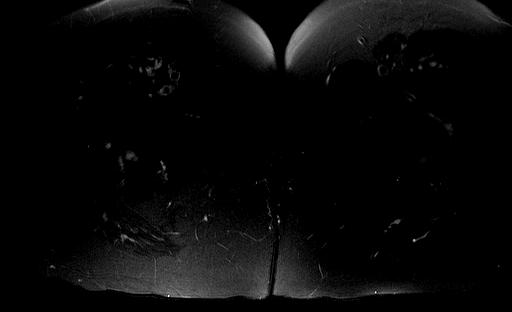
[im 11/28]
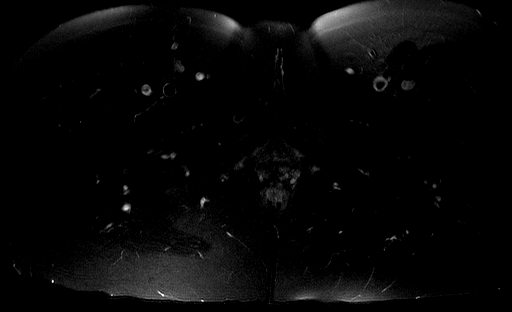
[im 14/28]
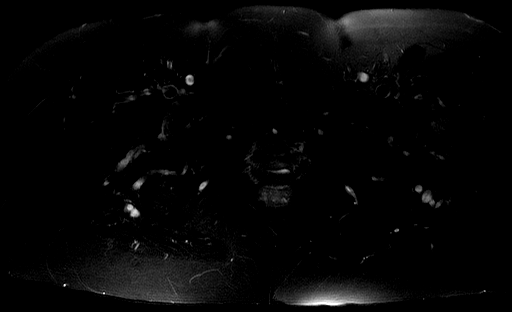
[im 17/28]
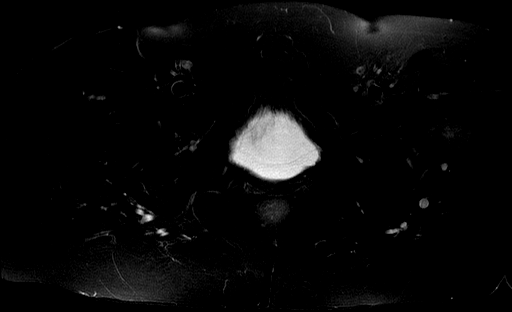
[im 21/28]
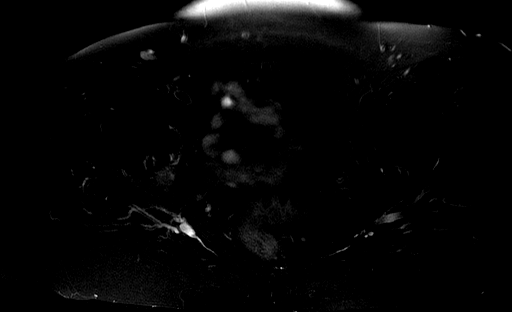
[im 24/28]
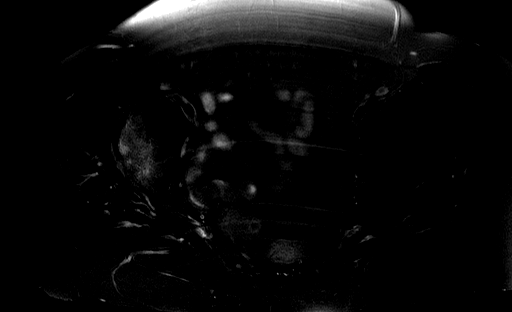
[im 28/28]
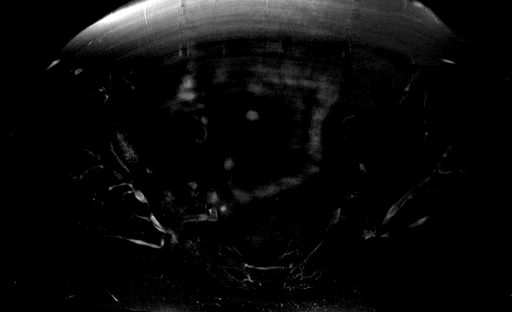

[Series 5: T1 fat-sat · axial · 6.0mm · 0.74mm/px · z∈[-133,-6]mm · 5 of 29 slices shown]
[im 1/29]
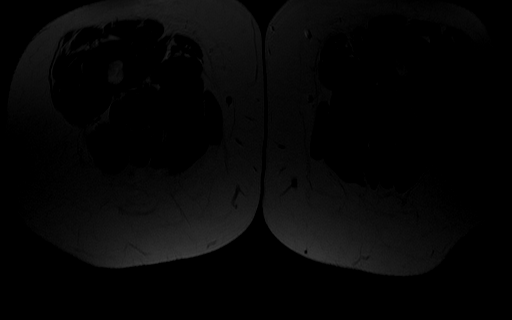
[im 4/29]
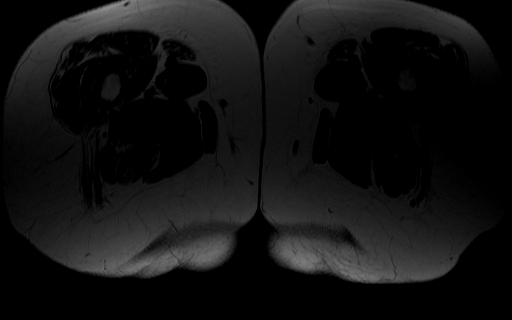
[im 8/29]
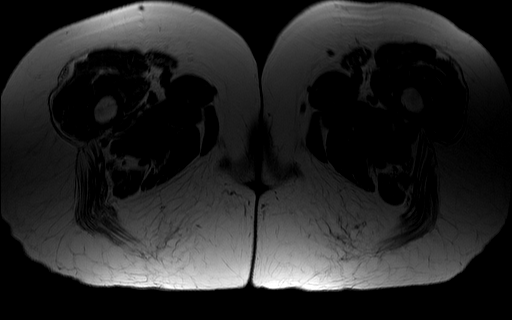
[im 11/29]
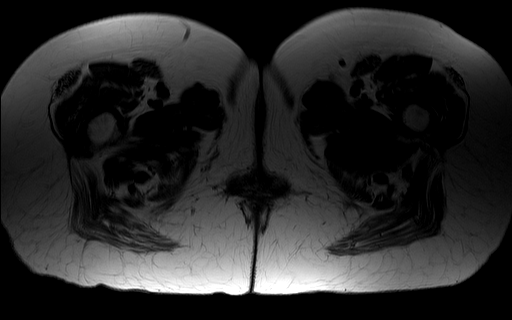
[im 18/29]
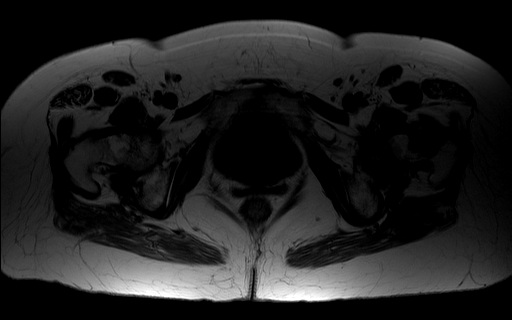

[Series 6: T2 fat-sat · sagittal · 3.5mm · 0.57mm/px · 7 of 21 slices shown (2 of 3)]
[im 1/21]
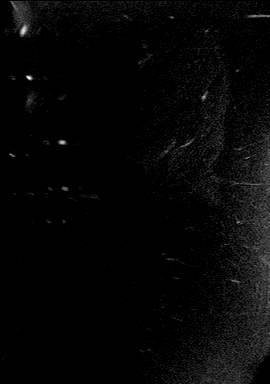
[im 4/21]
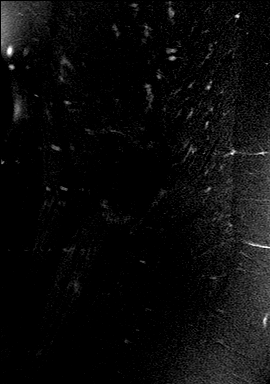
[im 7/21]
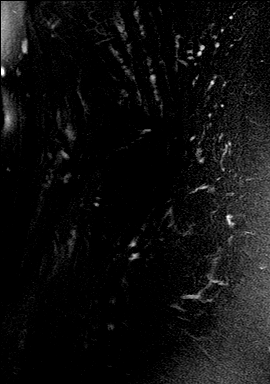
[im 11/21]
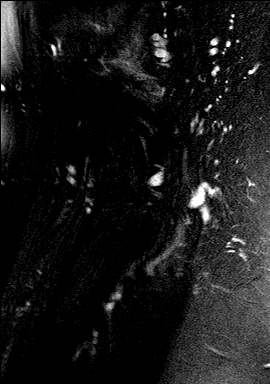
[im 14/21]
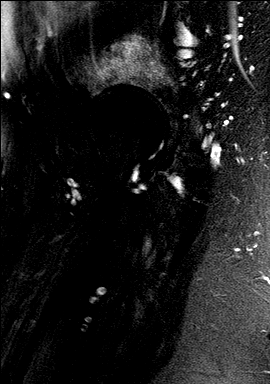
[im 17/21]
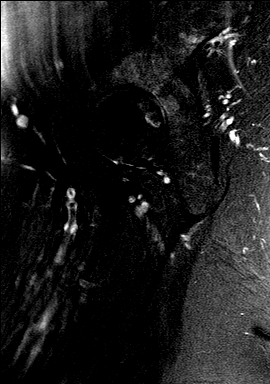
[im 21/21]
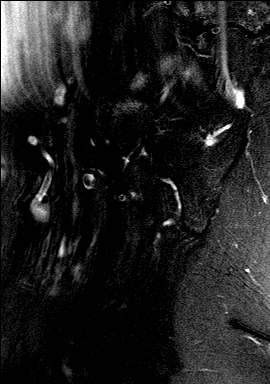

[Series 7: T2 fat-sat · sagittal · 3.5mm · 0.57mm/px · 7 of 21 slices shown (3 of 3)]
[im 1/21]
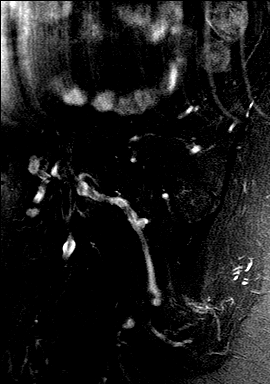
[im 4/21]
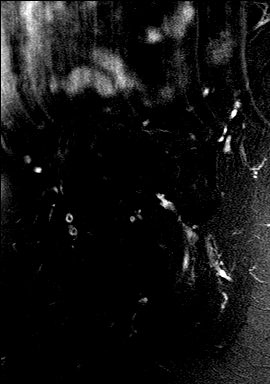
[im 7/21]
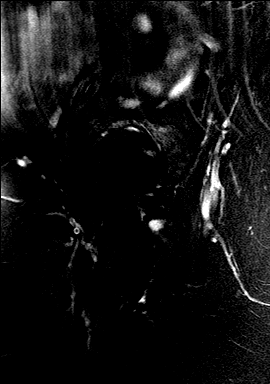
[im 11/21]
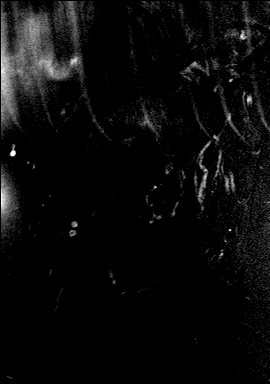
[im 14/21]
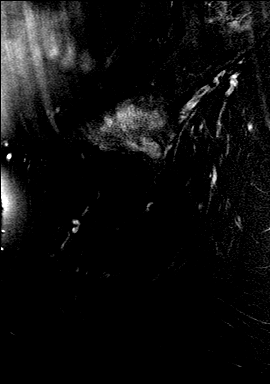
[im 17/21]
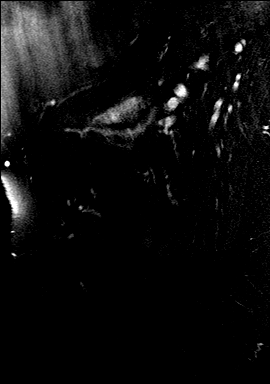
[im 21/21]
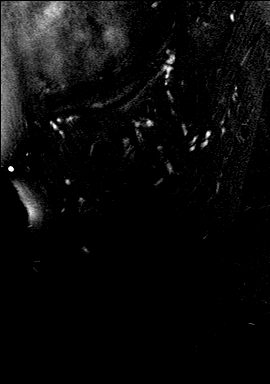

[34 of 48 positions shown; findings below may reference images not displayed]

DIAGNOSTIC STUDIES

EXAM
MRI right hip without contrast.

INDICATION
hip pain
RT LATERAL HIP PAIN FOR 6 MONTHS. WORSENING AT THIS TIME. UNABLE TO LAY ON THAT HIP NOW.

TECHNIQUE
Sagittal axial and coronal images were obtained with variable T1 and T2 weighting.

COMPARISONS
None available

FINDINGS
There is bilateral avascular necrosis involving the femoral heads best seen on image 14 series 3.
There is no evidence for subchondral collapse of either hip.
In addition, there are multiple osseous lesions throughout the pelvis and intertrochanteric region
of the left hip. On the left hip, there is a large lesion image 9 series 3. There is also edema and
irregularity of the right supra-acetabular region image 13 series 2. There is involvement of the
right sacral ala. Leading differential considerations would be metastatic disease or myeloma. Bone
scan and appropriate laboratory studies are recommended.

IMPRESSION
Scattered concerning osseous lesions throughout the pelvis with the largest lesion involving the
intertrochanteric aspect of the left hip. The appearance is concerning for metastatic disease or
myeloma. Bone scan and appropriate laboratory values are recommended.
Chronic bilateral avascular necrosis without evidence subchondral collapse or significant edema.

Tech Notes:

RT LATERAL HIP PAIN FOR 6 MONTHS.  WORSENING AT THIS TIME.  UNABLE TO LAY ON THAT HIP NOW.

## 2020-03-08 IMAGING — MG TOMO IMPLANT
12 of 16 series · 12 of 16 positions shown · non-contrast
Comparison: none

[R CC (1 of 2)]
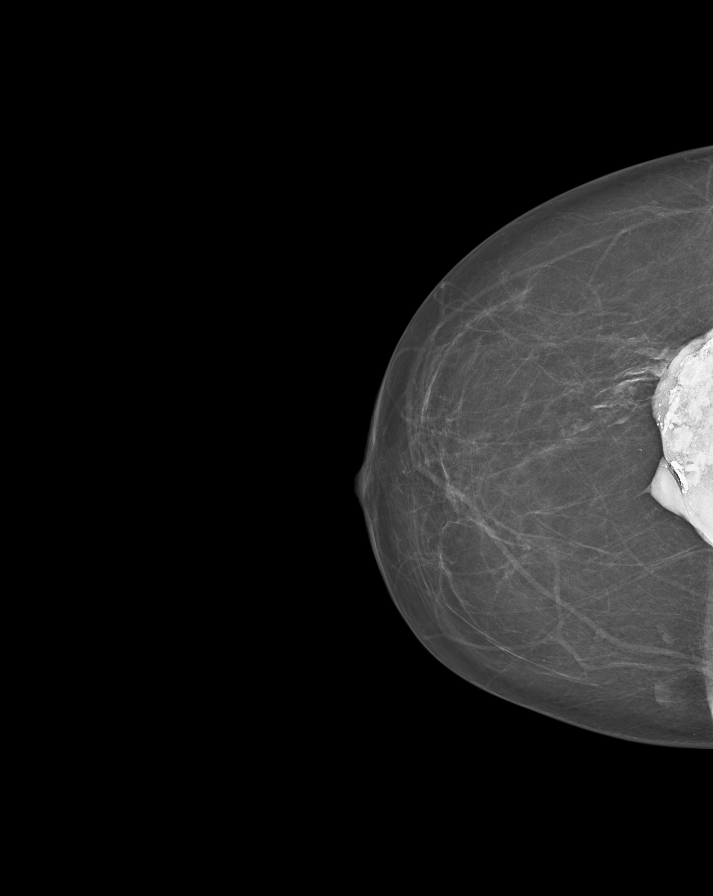

[R tomo (1 of 2)]
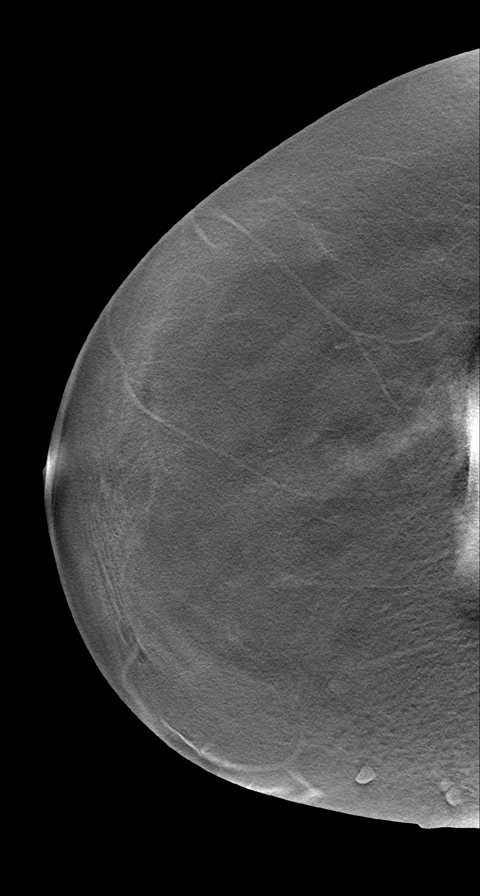

[R CC (2 of 2)]
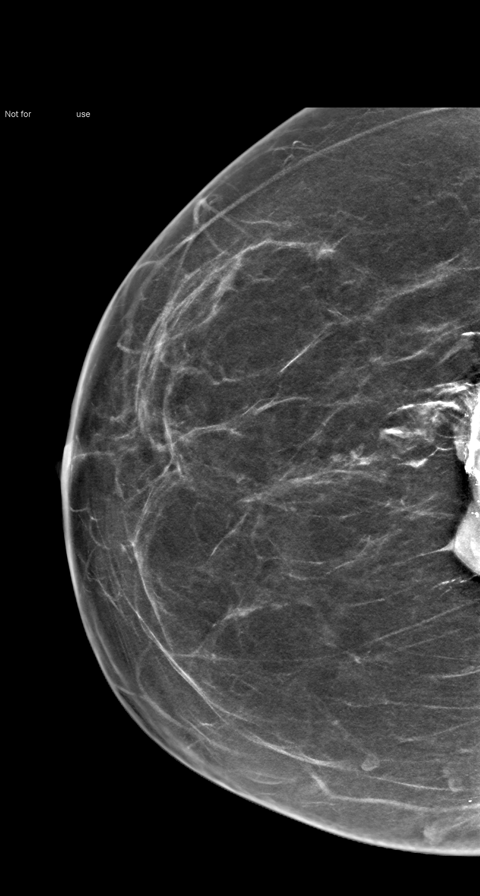

[R (1 of 2)]
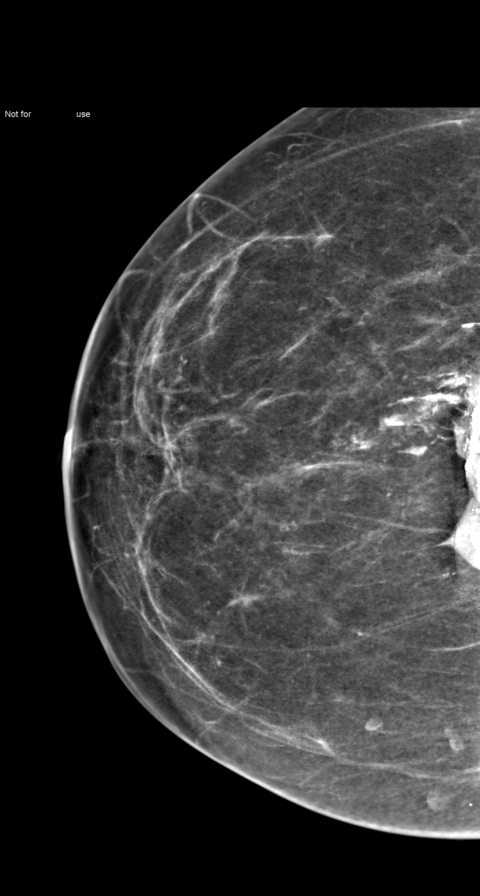

[L CC (1 of 2)]
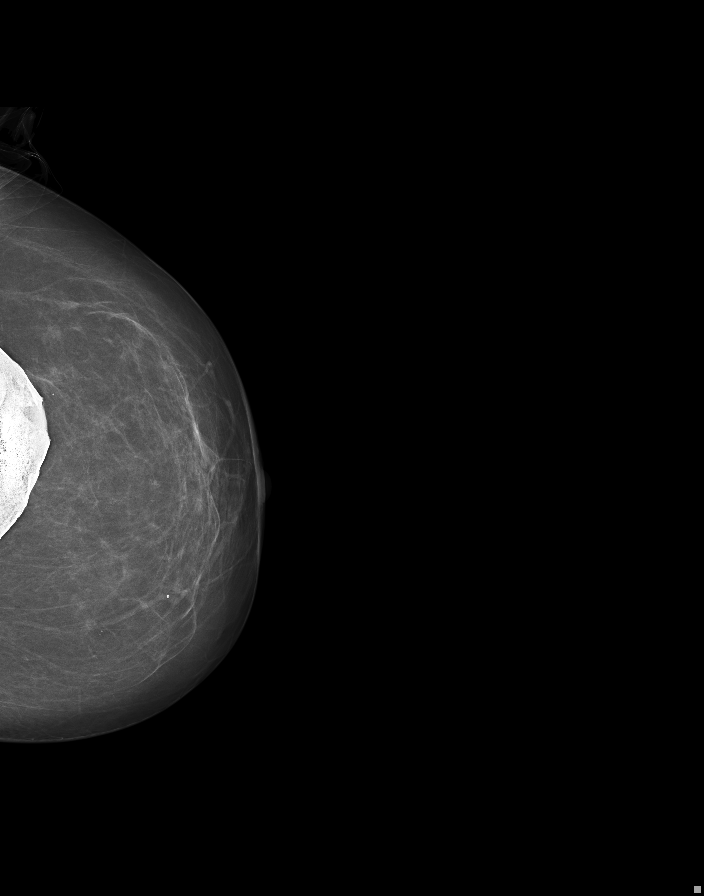

[L tomo]
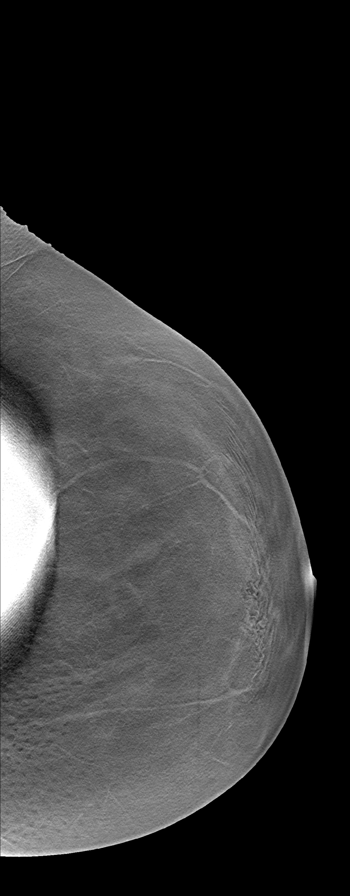

[L CC (2 of 2)]
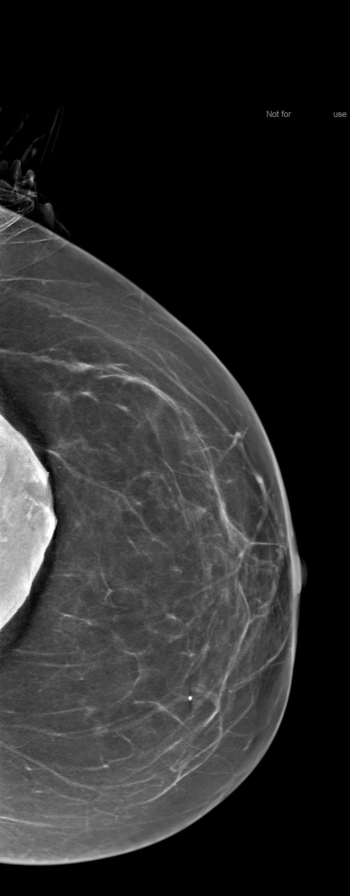

[L]
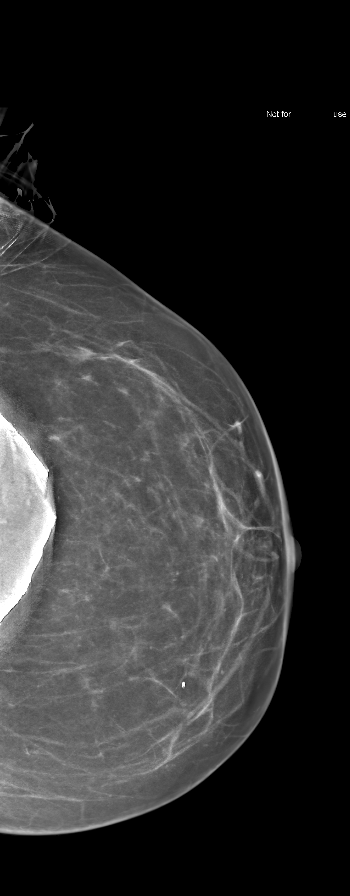

[R MLO (1 of 2)]
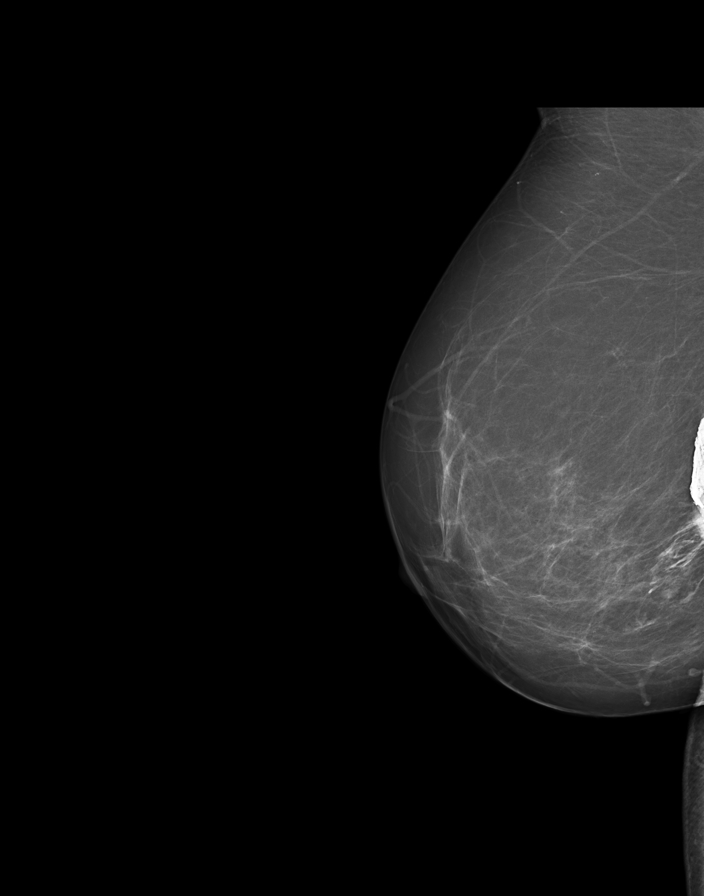

[R tomo (2 of 2)]
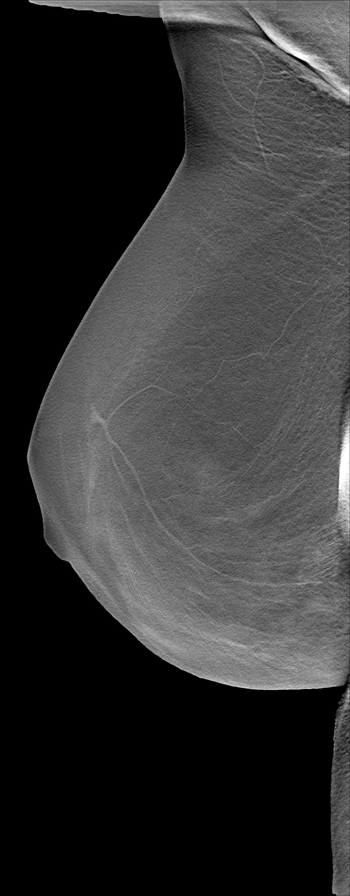

[R MLO (2 of 2)]
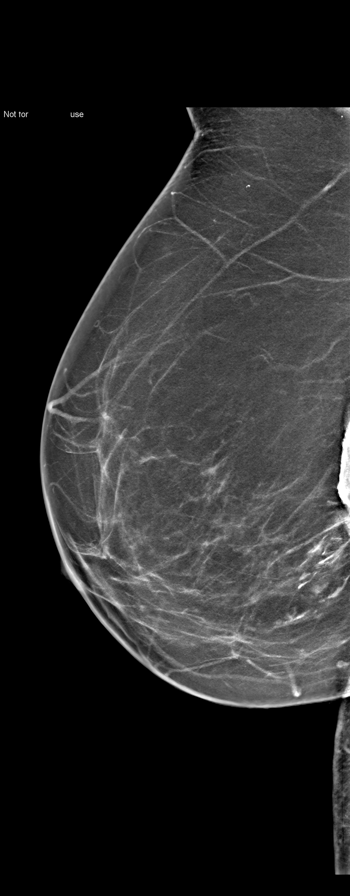

[R (2 of 2)]
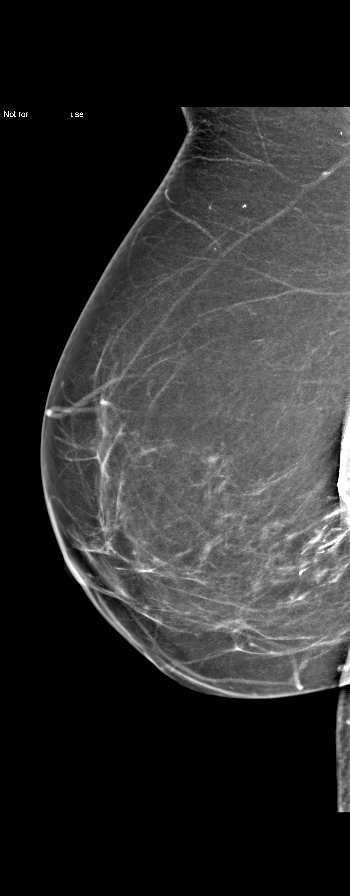

[12 of 16 positions shown; findings below may reference images not displayed]

DIAGNOSTIC STUDIES

EXAM

Screening mammogram.

INDICATION

screening
SCR IMPLANTS. NO OLD IMAGES. RECENTLY DX WITH BONE CA. LOOKING FOR PRIMARY. NO LUMPS/PAIN. LM

TECHNIQUE

Tomographic and digitally acquired CC and MLO views of both breasts with implant displaced views
also obtained. CAD utilized for interpretation.

COMPARISONS

None.

FINDINGS

Right retropectoral on left subglandular silicone implants. There is evidence of extra luminal
silicone in the right posterior chest. Scattered fibroglandular density is otherwise noted. Benign
microcalcifications. No suspicious mass or concerning microcalcifications.

IMPRESSION

No suspicious findings. Benign, BI-RADS category 2.

RECOMMENDATION

Annual screening mammography. A notification letter will be sent to the patient regarding findings
and recommendation.

Tech Notes:

## 2020-03-15 IMAGING — CT CHESTW
2 of 4 series · 11 of 36 positions shown, 13 images · IV contrast (Omnipaque)
Comparison: none

[Series 2: thorax ax 5.00 br40 s3 · axial · 0.53mm/px · z∈[+1655,+1944]mm · 8 of 66 slices shown, 10 images]
[im 5/66  mediastinal]
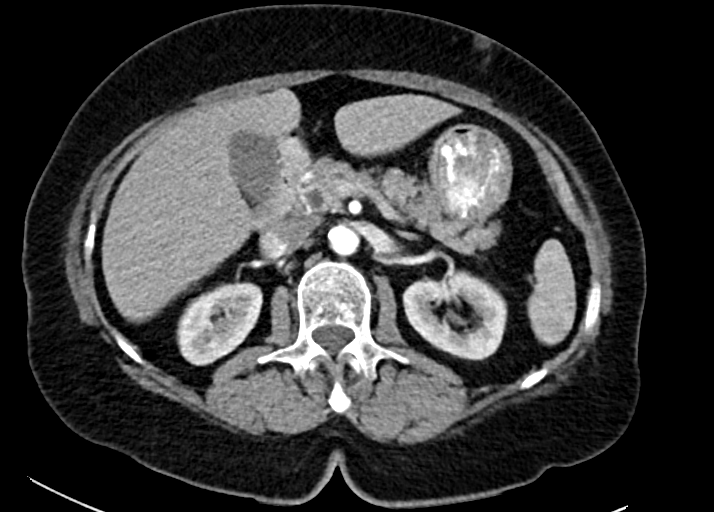
[im 5/66  lung]
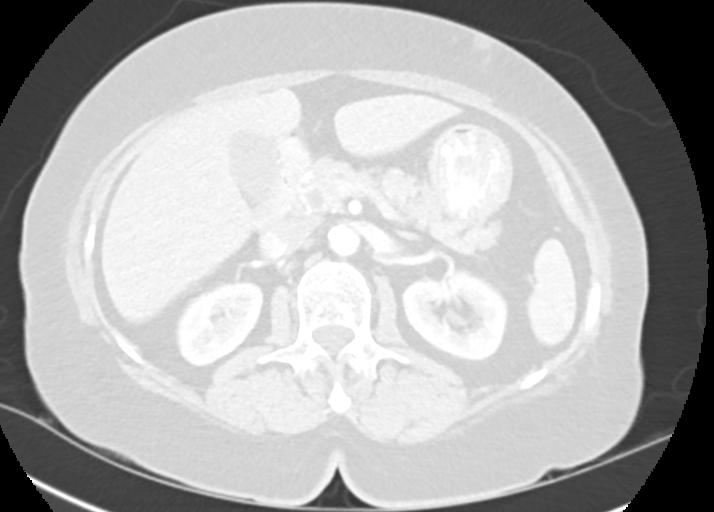
[im 14/66  lung]
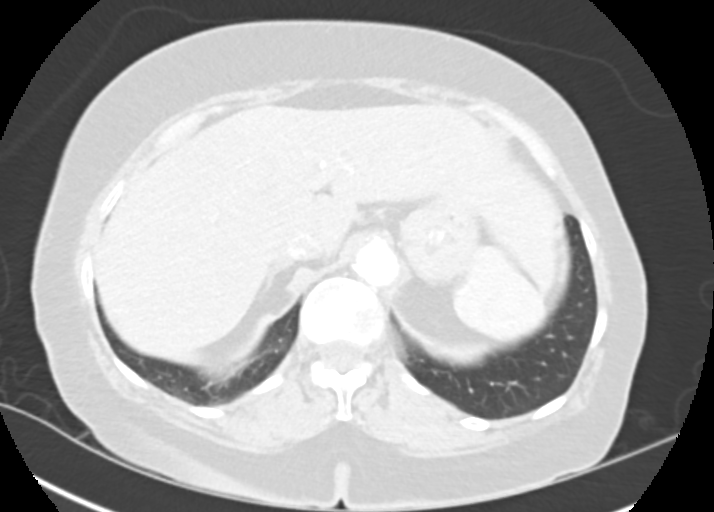
[im 24/66  lung]
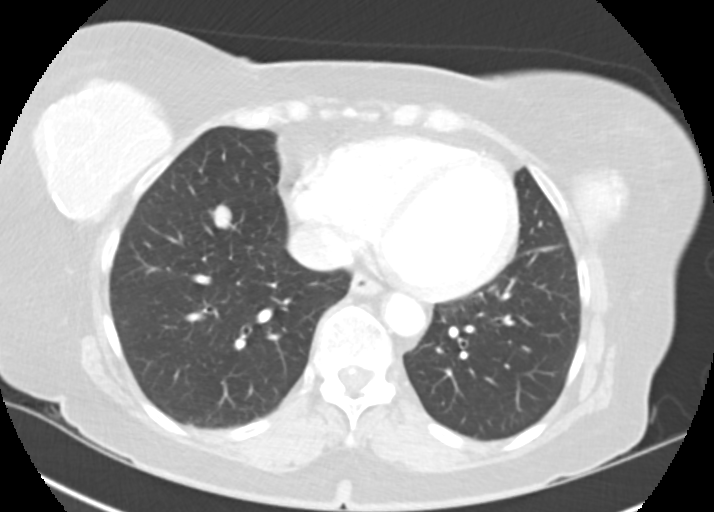
[im 28/66  lung]
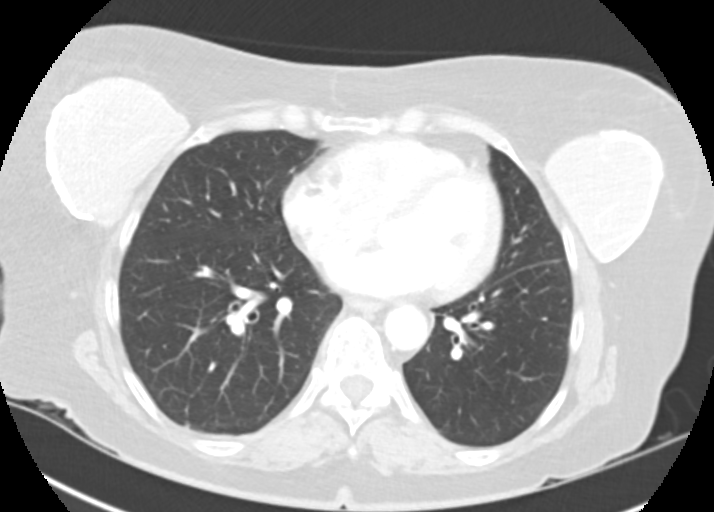
[im 38/66  mediastinal]
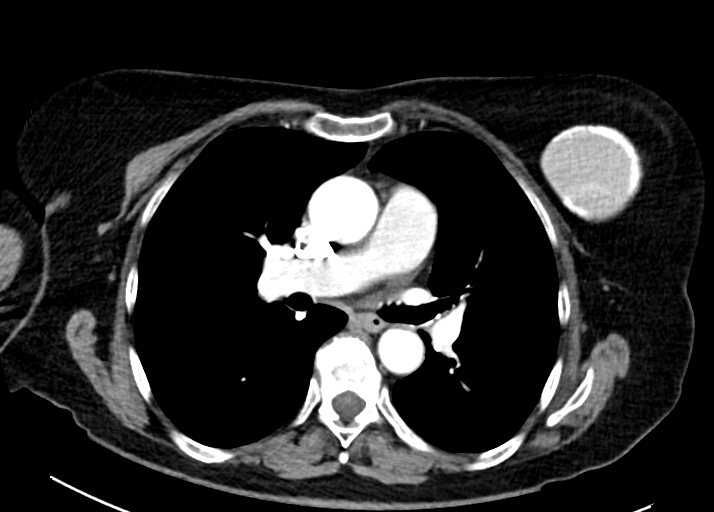
[im 38/66  lung]
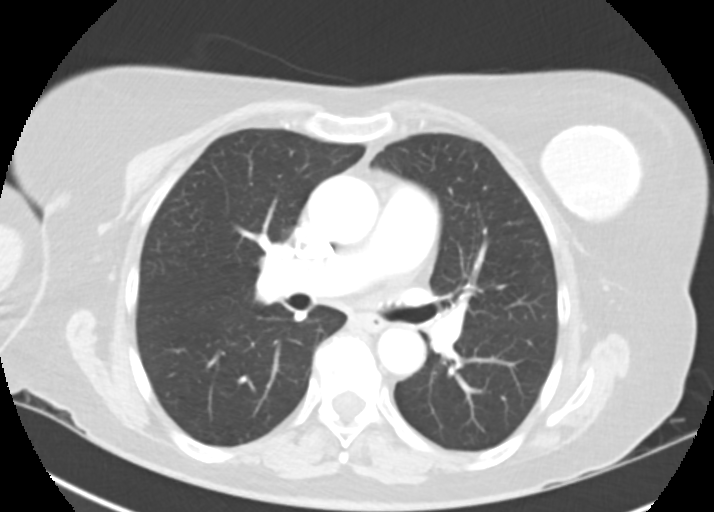
[im 42/66  lung]
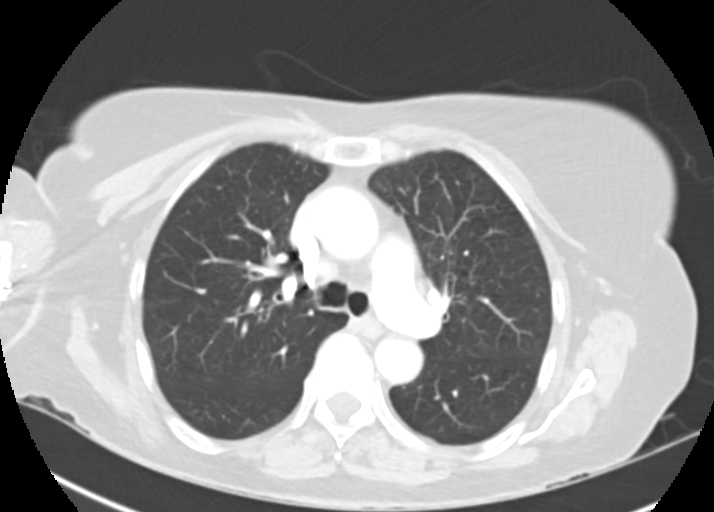
[im 52/66  lung]
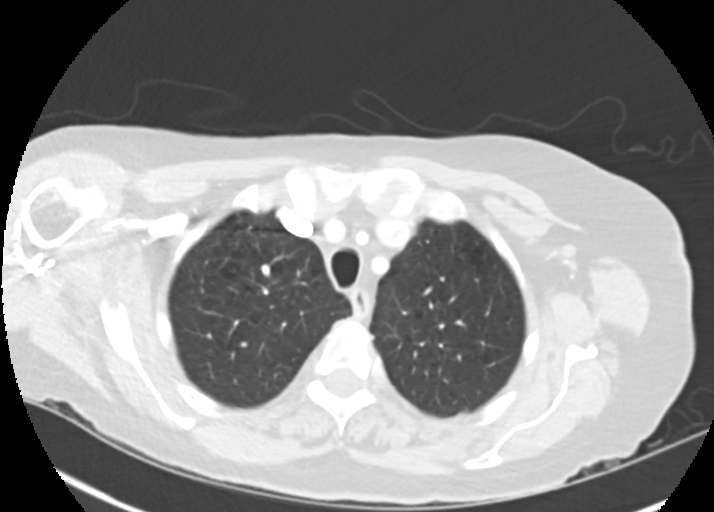
[im 61/66  lung]
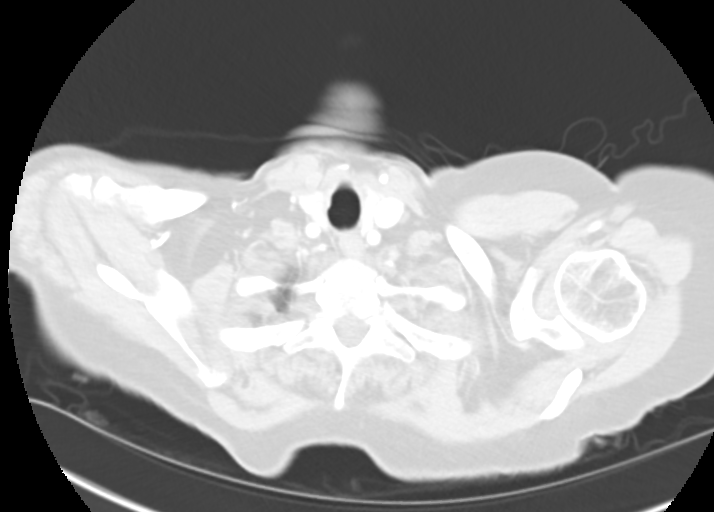

[Series 4: thorax cor 5.00 br40 s3 · coronal · 0.67mm/px · 3 of 54 slices shown]
[im 11/54  lung]
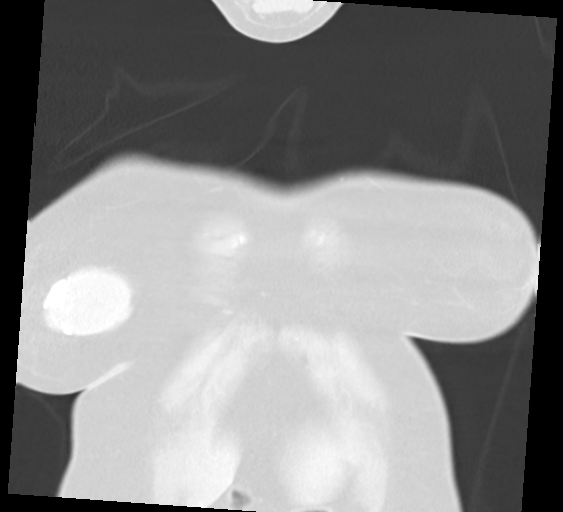
[im 22/54  lung]
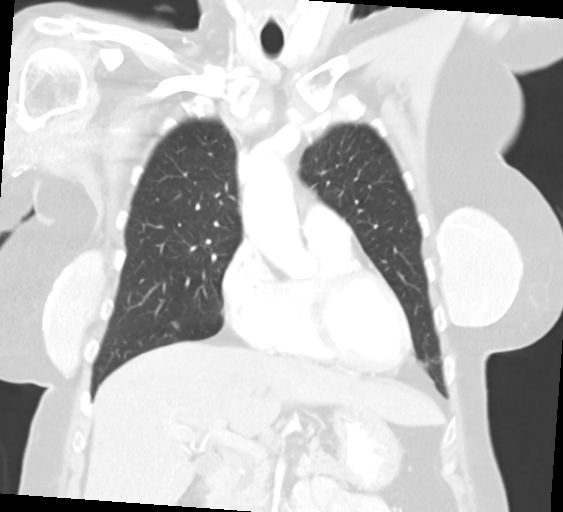
[im 32/54  lung]
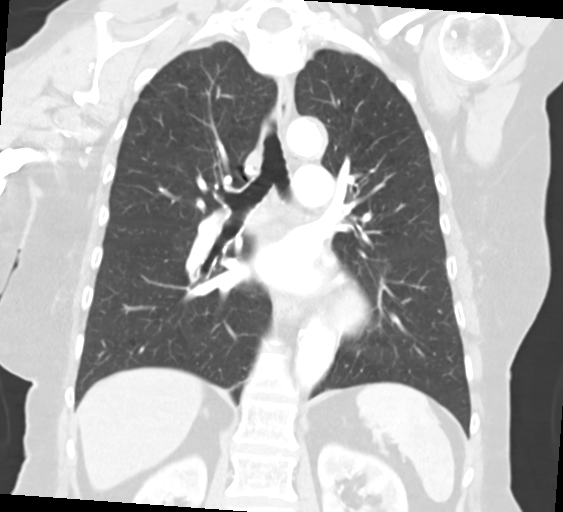

[11 of 36 positions shown; findings below may reference images not displayed]

DIAGNOSTIC STUDIES

EXAM

COMPUTED TOMOGRAPHY, THORAX WITH CONTRAST MATERIAL CPT 16629; COMPUTED TOMOGRAPHY, ABDOMEN AND
PELVIS WITH CONTRAST MATERIAL CPT 73577

INDICATION

new bone lesions suspicious for mets
PT C/O BODY PAIN. F/U BONE LESION IN PELVIS,SEEN ON CT LSPINE. HX OF HTN, DIVERTICULITS,
APPENDECTOMY, AND HYSTERECTOMY. GFR 63. W22L1 [id number]. CT/NM 1/0. TJ

TECHNIQUE

Contiguous axial tomographic images were obtained from the lung apices to the symphysis pubis with
both oral and intravenous contrast.  Coronal and sagittal reformatted imaging was performed.

All CT scans at this facility use dose modulation, iterative reconstruction, and/or weight based
dosing when appropriate to reduce radiation dose to as low as reasonably achievable.

1 CT and 0 nuclear scans in the last year.

COMPARISONS

CT lumbar 08/15/2018, MRI right hip 07/30/2018 and CT of the abdomen and pelvis 03/29/2012

FINDINGS

Chest: The heart appears normal in size. No pericardial effusion. Mild motion artifact at the
aortic root. Otherwise no aortic aneurysm or dissection. No definite pulmonary embolism. No
pathologically enlarged lymph nodes. No pleural effusion. Centrilobular emphysematous changes. There
is a 0.4 x 1.1 cm right upper lobe nodule, image 13. There is a 1.0 x 1.3 cm right lower lobe
nodule, contiguous with the major fissure, image 44. Focal ground-glass opacity within the right
upper lobe, image 22.

Abdomen and pelvis: The liver, spleen, pancreas, gallbladder and adrenal glands appear grossly
unremarkable. There is no hydronephrosis. No bowel obstruction or free air. There is a 1.7 cm
polypoid lesion within the sigmoid colon. Severe sigmoid diverticulosis. Moderate gastric mucosal
thickening. Moderate atherosclerotic disease. Subcentimeter retroperitoneal nodes. The urinary
bladder is incompletely distended. Hysterectomy. The adnexa appear grossly unremarkable.

Musculoskeletal: Sclerotic lesion involving the intertrochanteric region of the left hip. There is
a 0.9 x 2.2 cm sclerotic lesion involving the right ilium, image 89. There is a 1.3 x 2.3 cm
heterogeneous/sclerotic lesion involving the T8 vertebral body on the right. Early avascular
necrosis of the hips.

IMPRESSION

1. COPD.  There is a 0.4 x 1.1 cm right upper lobe nodule, image 13. There is a 1.0 x 1.3 cm right
lower lobe nodule, contiguous with the major fissure, image 44. The right lower lobe nodule is more
suspicious for a neoplasm.

2. Sclerotic metastases involving the intertrochanteric region of the left hip, right ilium and the
T8 vertebral body.

Findings may be consistent with metastatic lung carcinoma. Follow-up with a pulmonary and oncology
consultation is recommended.

3. There is a 1.7 cm polypoid lesion within the sigmoid colon. Correlation with a recent
colonoscopy is recommended.

Tech Notes:

PT C/O BODY PAIN. F/U BONE LESION IN PELVIS,SEEN ON CT LSPINE. HX OF HTN, DIVERTICULITS,
APPENDECTOMY, AND HYSTERECTOMY. GFR 63. W22L1 [id number]. CT/NM 1/0. TJ

## 2020-11-08 IMAGING — MR Head^Brain
10 series · 43 of 48 positions shown · non-contrast
Comparison: none

[Series 2: T1 · sagittal · 5.0mm · 0.45mm/px · 4 of 20 slices shown (1 of 2)]
[im 1/20]
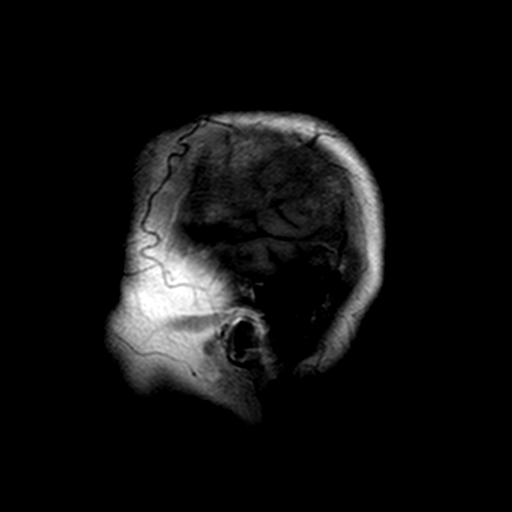
[im 7/20]
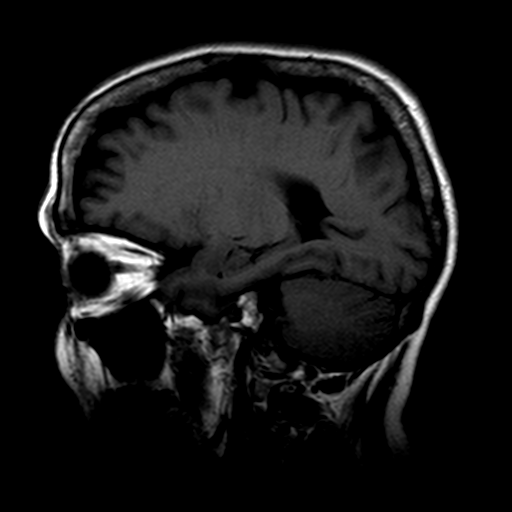
[im 13/20]
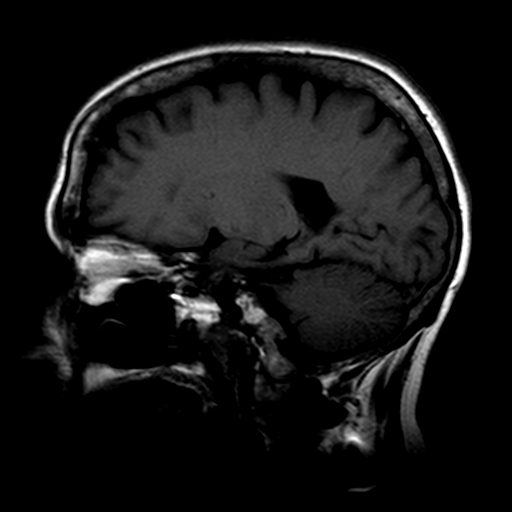
[im 20/20]
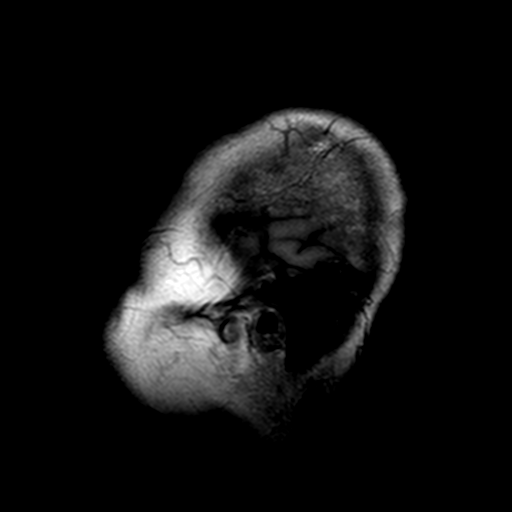

[Series 3: DWI · axial · 5.0mm · 1.80mm/px · z∈[-103,+26]mm · 8 of 58 slices shown (1 of 2)]
[im 1/58]
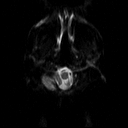
[im 7/58]
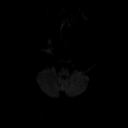
[im 20/58]
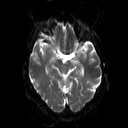
[im 26/58]
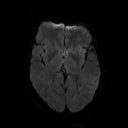
[im 32/58]
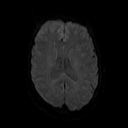
[im 39/58]
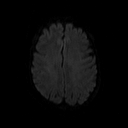
[im 51/58]
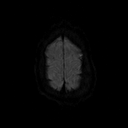
[im 58/58]
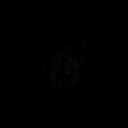

[Series 4: DWI · axial · 5.0mm · 1.80mm/px · z∈[-103,+26]mm · 4 of 21 slices shown (2 of 2)]
[im 1/21]
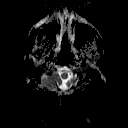
[im 7/21]
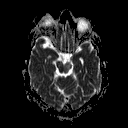
[im 14/21]
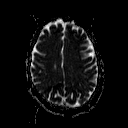
[im 21/21]
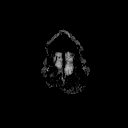

[Series 5: FLAIR · axial · 3.0mm · 0.45mm/px · z∈[-90,+23]mm · 5 of 30 slices shown]
[im 1/30]
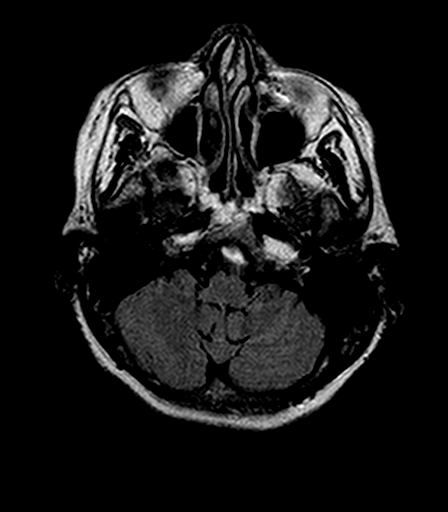
[im 8/30]
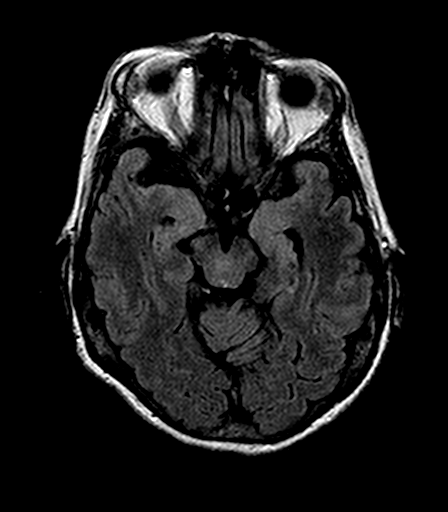
[im 15/30]
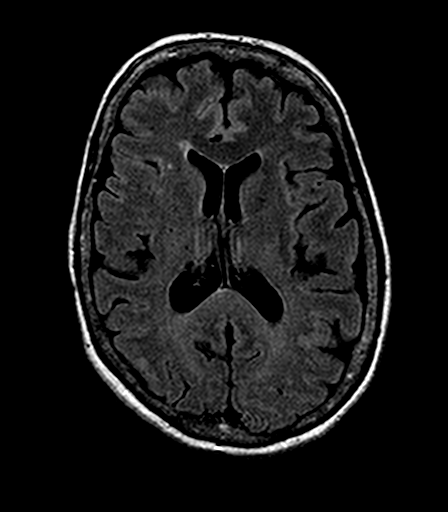
[im 22/30]
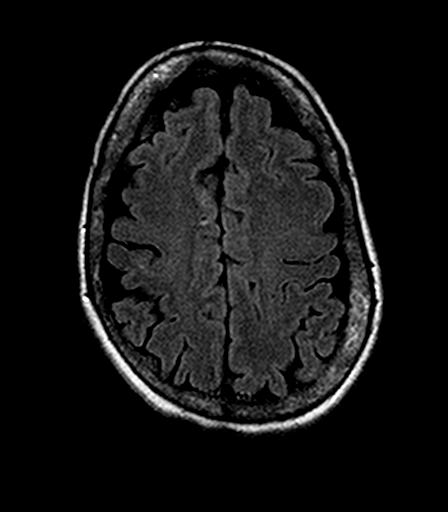
[im 30/30]
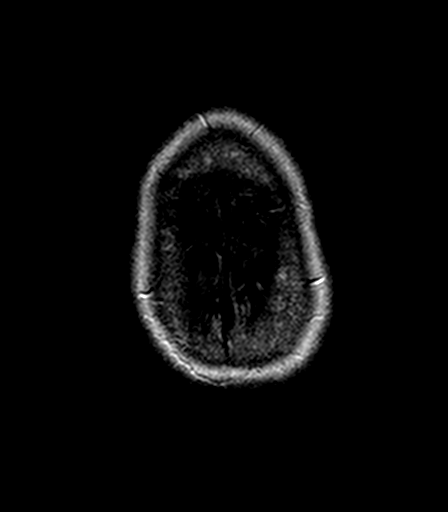

[Series 6: T2 · axial · 5.0mm · 0.72mm/px · z∈[-104,+43]mm · 4 of 24 slices shown (1 of 2)]
[im 1/24]
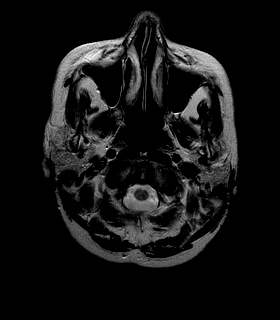
[im 8/24]
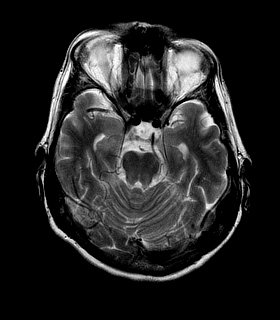
[im 16/24]
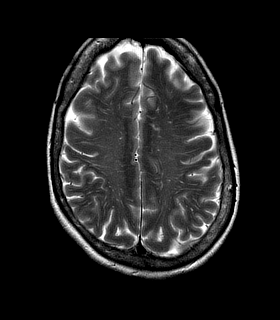
[im 24/24]
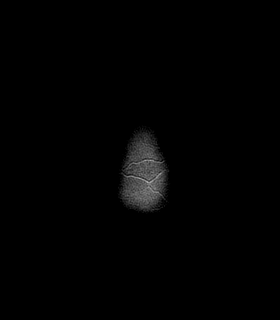

[Series 7: T1 · axial · 5.0mm · 0.45mm/px · z∈[-104,+44]mm · 4 of 23 slices shown (2 of 2)]
[im 1/23]
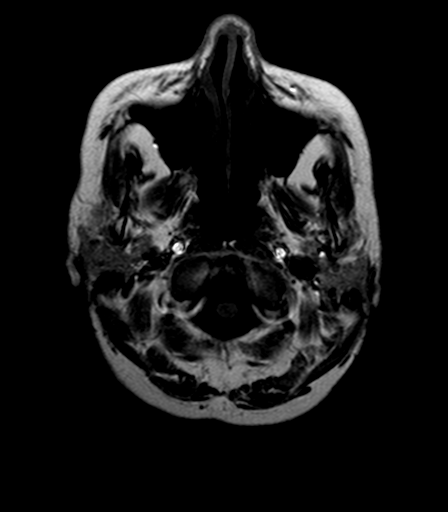
[im 8/23]
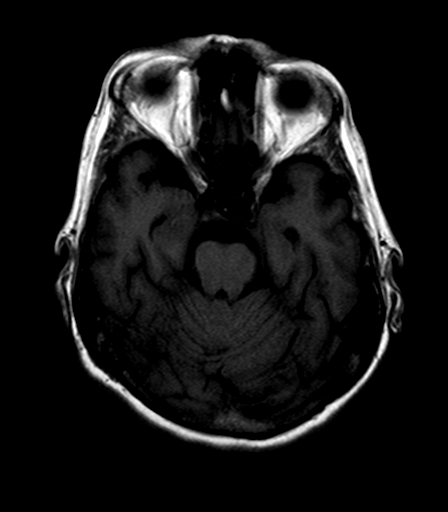
[im 15/23]
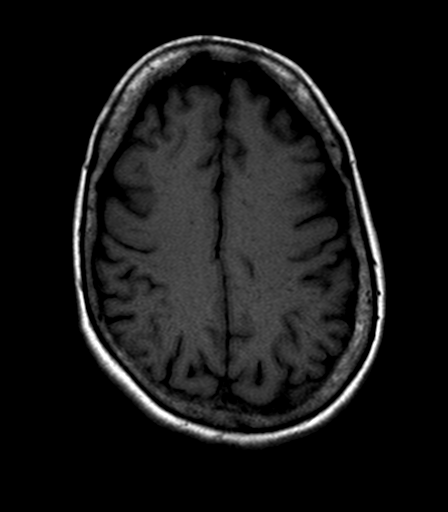
[im 23/23]
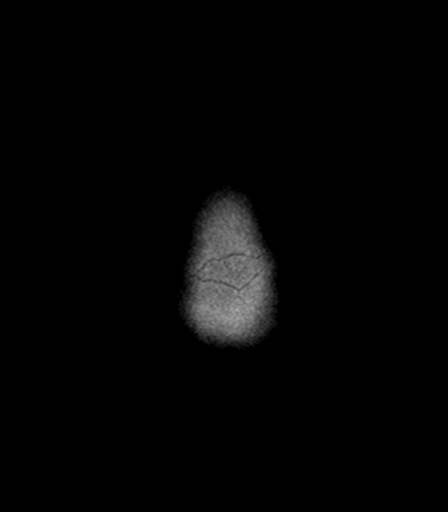

[Series 8: axial blood · axial · 5.0mm · 0.45mm/px · 1 of 21 slices shown]
[im 1/21]
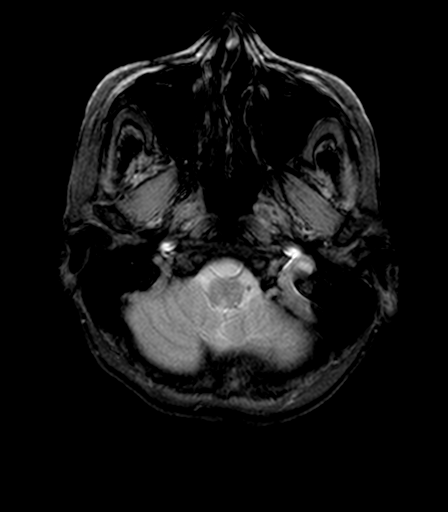

[Series 9: T2 · coronal · 5.0mm · 0.69mm/px · 5 of 26 slices shown (2 of 2)]
[im 1/26]
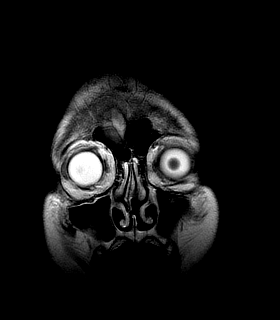
[im 7/26]
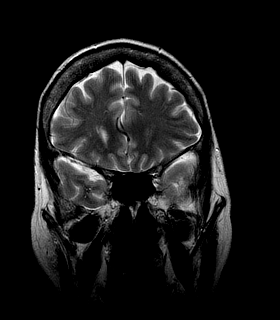
[im 13/26]
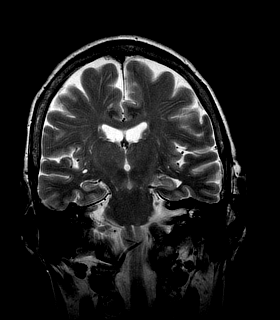
[im 19/26]
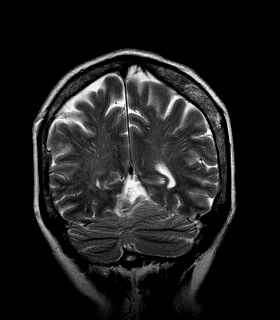
[im 26/26]
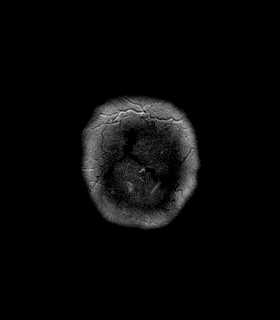

[Series 10: T1 fat-sat post-contrast · axial · 5.0mm · 0.90mm/px · z∈[-106,+42]mm · 4 of 24 slices shown (1 of 2)]
[im 1/24]
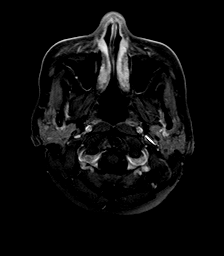
[im 8/24]
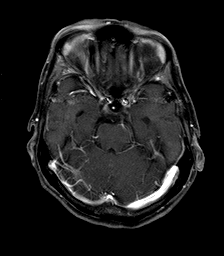
[im 16/24]
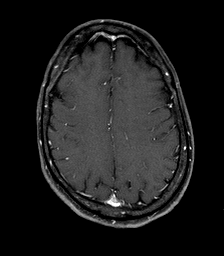
[im 24/24]
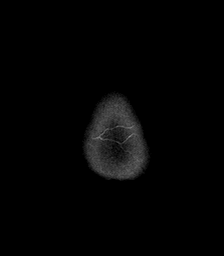

[Series 11: T1 fat-sat post-contrast · coronal · 5.0mm · 0.90mm/px · 4 of 25 slices shown (2 of 2)]
[im 1/25]
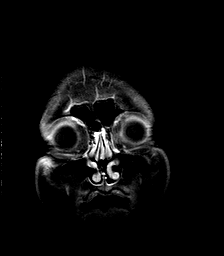
[im 9/25]
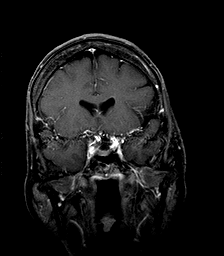
[im 17/25]
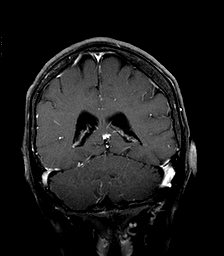
[im 25/25]
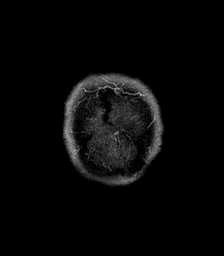

[43 of 48 positions shown; findings below may reference images not displayed]

DIAGNOSTIC STUDIES

EXAM

MRI of the brain with without contrast.

INDICATION

Dizziness, lung cancer r/o mets
FU CA DX.  PT IS HAVING MORE HEADACHES IN THE LAST FEW MONTHS.  15 ML GADAVIST RG

TECHNIQUE

Sagittal axial and coronal images were obtained with variable T1 and T2 weighting before and after
administration of Gadavist.

COMPARISONS

CT head March 09, 2018

FINDINGS

No abnormal signal properties of the brainstem or visualized cervical cord are seen. The expected
signal voids are noted throughout the major intracranial vascular structures, paranasal sinuses, and
mastoid air cells.

No intracranial mass or hemorrhage is seen. No abnormal enhancement is noted throughout the brain
parenchyma. There is no evidence for acute ischemia. A few tiny punctate foci of T2 signal are
evident within the supratentorial white matter. These are nonspecific and may reflect prior trauma
or migraines versus minimal small vessel ischemic disease.

IMPRESSION

No intracranial mass or hemorrhage. There is no evidence for metastatic disease.

Scattered nonspecific punctate foci of increased T2 signal within the supratentorial white matter
as described above.

Tech Notes:

FU CA DX.  PT IS HAVING MORE HEADACHES IN THE LAST FEW MONTHS.  15 ML GADAVIST RG

## 2021-03-17 DEATH — deceased

## 2021-08-30 ENCOUNTER — Encounter: Admit: 2021-08-30 | Discharge: 2021-08-30 | Payer: Commercial Managed Care - Pharmacy Benefit Manager

## 2022-02-15 ENCOUNTER — Encounter: Admit: 2022-02-15 | Discharge: 2022-02-15 | Payer: Commercial Managed Care - Pharmacy Benefit Manager
